# Patient Record
Sex: Female | Born: 1965 | Race: Black or African American | Hispanic: No | Marital: Single | Smoking: Never smoker
Health system: Southern US, Community
[De-identification: ages and names within clinical notes are randomized; demographics above are authoritative.]

## PROBLEM LIST (undated history)

## (undated) DIAGNOSIS — I10 Essential (primary) hypertension: Secondary | ICD-10-CM

## (undated) DIAGNOSIS — K219 Gastro-esophageal reflux disease without esophagitis: Secondary | ICD-10-CM

## (undated) DIAGNOSIS — F419 Anxiety disorder, unspecified: Secondary | ICD-10-CM

## (undated) HISTORY — PX: ABDOMINAL HYSTERECTOMY: SHX81

## (undated) HISTORY — PX: ANKLE SURGERY: SHX546

## (undated) HISTORY — DX: Anxiety disorder, unspecified: F41.9

## (undated) HISTORY — PX: TONSILLECTOMY: SUR1361

## (undated) HISTORY — DX: Gastro-esophageal reflux disease without esophagitis: K21.9

---

## 2016-02-07 DIAGNOSIS — F418 Other specified anxiety disorders: Secondary | ICD-10-CM | POA: Insufficient documentation

## 2016-05-09 DIAGNOSIS — H6982 Other specified disorders of Eustachian tube, left ear: Secondary | ICD-10-CM | POA: Insufficient documentation

## 2018-03-25 DIAGNOSIS — I10 Essential (primary) hypertension: Secondary | ICD-10-CM | POA: Insufficient documentation

## 2019-01-16 ENCOUNTER — Other Ambulatory Visit: Payer: Self-pay

## 2019-01-16 DIAGNOSIS — Z20822 Contact with and (suspected) exposure to covid-19: Secondary | ICD-10-CM

## 2019-01-17 LAB — NOVEL CORONAVIRUS, NAA: SARS-CoV-2, NAA: NOT DETECTED

## 2019-05-18 ENCOUNTER — Ambulatory Visit
Admission: EM | Admit: 2019-05-18 | Discharge: 2019-05-18 | Disposition: A | Discharge status: Home / Self Care | Payer: BC Managed Care – PPO | Attending: Emergency Medicine | Admitting: Emergency Medicine

## 2019-05-18 ENCOUNTER — Other Ambulatory Visit: Payer: Self-pay

## 2019-05-18 DIAGNOSIS — Z20828 Contact with and (suspected) exposure to other viral communicable diseases: Secondary | ICD-10-CM

## 2019-05-18 DIAGNOSIS — Z20822 Contact with and (suspected) exposure to covid-19: Secondary | ICD-10-CM

## 2019-05-18 NOTE — Discharge Instructions (Addendum)
COVID testing ordered.  It will take between 5-7 days for test results.  Someone will contact you regarding abnormal results.   ° °In the meantime: °You should remain isolated in your home for 10 days from symptom onset AND greater than 72 hours after symptoms resolution (absence of fever without the use of fever-reducing medication and improvement in respiratory symptoms), whichever is longer °OR 14 days from exposure °Get plenty of rest and push fluids °Use OTC zyrtec for nasal congestion, runny nose, and/or sore throat °Use OTC flonase for nasal congestion and runny nose °Use medications daily for symptom relief °Use OTC medications like ibuprofen or tylenol as needed fever or pain °Call or go to the ED if you have any new or worsening symptoms such as fever, worsening cough, shortness of breath, chest tightness, chest pain, turning blue, changes in mental status, etc...  °

## 2019-05-18 NOTE — ED Provider Notes (Signed)
[place]   [id number] 05/18/19 Arrival Time: 1938   CC: COVID exposure; covid test  SUBJECTIVE: History from: patient.  Kristen Santiago is a 53 y.o. female who presents for COVID testing.  COVID positive exposure 8 days ago to mother.  Denies recent travel.  Denies aggravating or alleviating symptoms.  Denies previous COVID infection.   Denies fever, chills, fatigue, nasal congestion, rhinorrhea, sore throat, cough, SOB, wheezing, chest pain, nausea, vomiting, changes in bowel or bladder habits.    ROS: As per HPI.  All other pertinent ROS negative.     History reviewed. No pertinent past medical history. History reviewed. No pertinent surgical history. No Known Allergies No current facility-administered medications on file prior to encounter.    No current outpatient medications on file prior to encounter.   Social History   Socioeconomic History  . Marital status: Single    Spouse name: Not on file  . Number of children: Not on file  . Years of education: Not on file  . Highest education level: Not on file  Occupational History  . Not on file  Social Needs  . Financial resource strain: Not on file  . Food insecurity    Worry: Not on file    Inability: Not on file  . Transportation needs    Medical: Not on file    Non-medical: Not on file  Tobacco Use  . Smoking status: Not on file  Substance and Sexual Activity  . Alcohol use: Not on file  . Drug use: Not on file  . Sexual activity: Not on file  Lifestyle  . Physical activity    Days per week: Not on file    Minutes per session: Not on file  . Stress: Not on file  Relationships  . Social Herbalist on phone: Not on file    Gets together: Not on file    Attends religious service: Not on file    Active member of club or organization: Not on file    Attends meetings of clubs or organizations: Not on file    Relationship status: Not on file  . Intimate partner violence    Fear of  current or ex partner: Not on file    Emotionally abused: Not on file    Physically abused: Not on file    Forced sexual activity: Not on file  Other Topics Concern  . Not on file  Social History Narrative  . Not on file   Family History  Problem Relation Age of Onset  . Healthy Mother   . Healthy Father     OBJECTIVE:  Vitals:   05/18/19 1950  BP: (!) 163/88  Pulse: (!) 106  Resp: 16  Temp: 98.1 F (36.7 C)  TempSrc: Oral  SpO2: 96%     General appearance: alert; well-appearing, nontoxic; speaking in full sentences and tolerating own secretions HEENT: NCAT; Ears: EACs clear, TMs pearly gray; Eyes: PERRL.  EOM grossly intact. Nose: nares patent without rhinorrhea, Throat: oropharynx clear, tonsils non erythematous or enlarged, uvula midline  Neck: supple without LAD Lungs: unlabored respirations, symmetrical air entry; cough: absent; no respiratory distress; CTAB Heart: regular rate and rhythm.  Radial pulses 2+ symmetrical bilaterally Skin: warm and dry Psychological: alert and cooperative; normal mood and affect  ASSESSMENT & PLAN:  1. Exposure to COVID-19 virus   2. Encounter for laboratory testing for COVID-19 virus    COVID testing ordered.  It will take between 5-7 days  for test results.  Someone will contact you regarding abnormal results.    In the meantime: You should remain isolated in your home for 10 days from symptom onset AND greater than 72 hours after symptoms resolution (absence of fever without the use of fever-reducing medication and improvement in respiratory symptoms), whichever is longer Or 14 days from exposure Get plenty of rest and push fluids Use OTC zyrtec for nasal congestion, runny nose, and/or sore throat Use OTC flonase for nasal congestion and runny nose Use medications daily for symptom relief Use OTC medications like ibuprofen or tylenol as needed fever or pain Call or go to the ED if you have any new or worsening symptoms such as  fever, worsening cough, shortness of breath, chest tightness, chest pain, turning blue, changes in mental status, etc...   Reviewed expectations re: course of current medical issues. Questions answered. Outlined signs and symptoms indicating need for more acute intervention. Patient verbalized understanding. After Visit Summary given.         Rennis Harding, PA-C 05/18/19 2007

## 2019-05-18 NOTE — ED Triage Notes (Signed)
Pt presents to [facility] stating she had a positive covid exposure and would like a covid test. Pt denies symptoms at this time.

## 2019-05-20 LAB — NOVEL CORONAVIRUS, NAA: SARS-CoV-2, NAA: NOT DETECTED

## 2019-08-16 ENCOUNTER — Ambulatory Visit: Payer: BC Managed Care – PPO | Attending: Internal Medicine

## 2019-08-16 DIAGNOSIS — Z23 Encounter for immunization: Secondary | ICD-10-CM | POA: Insufficient documentation

## 2019-08-16 NOTE — Progress Notes (Signed)
   Covid-19 Vaccination Clinic  Name:  Kristen Santiago    MRN: [id number] DOB: 08-19-1965  08/16/2019  Kristen Santiago was observed post Covid-19 immunization for 30 minutes based on pre-vaccination screening without incident. She was provided with Vaccine Information Sheet and instruction to access the V-Safe system.   Kristen Santiago was instructed to call 911 with any severe reactions post vaccine: Marland Kitchen Difficulty breathing  . Swelling of face and throat  . A fast heartbeat  . A bad rash all over body  . Dizziness and weakness   Immunizations Administered    Name Date Dose VIS Date Route   Pfizer COVID-19 Vaccine 08/16/2019  5:09 PM 0.3 mL 05/22/2019 Intramuscular   Manufacturer: [employer], [employer]   Lot: [id number]   NDC: [address]-9

## 2019-09-06 ENCOUNTER — Ambulatory Visit: Payer: BC Managed Care – PPO | Attending: Internal Medicine

## 2019-09-06 ENCOUNTER — Other Ambulatory Visit: Payer: Self-pay

## 2019-09-06 DIAGNOSIS — Z23 Encounter for immunization: Secondary | ICD-10-CM

## 2019-09-06 NOTE — Progress Notes (Signed)
   Covid-19 Vaccination Clinic  Name:  [name]    MRN: [id number] DOB: 13-May-1966  09/06/2019  [name] was observed post Covid-19 immunization for 15 minutes without incident. She was provided with Vaccine Information Sheet and instruction to access the V-Safe system.   [name] was instructed to call 911 with any severe reactions post vaccine: Marland Kitchen Difficulty breathing  . Swelling of face and throat  . A fast heartbeat  . A bad rash all over body  . Dizziness and weakness   Immunizations Administered    Name Date Dose VIS Date Route   Pfizer COVID-19 Vaccine 09/06/2019  3:03 PM 0.3 mL 05/22/2019 Intramuscular   Manufacturer: [employer], [employer]   Lot: [id number]   NDC: [address]-7

## 2020-10-06 DIAGNOSIS — R0789 Other chest pain: Secondary | ICD-10-CM | POA: Insufficient documentation

## 2020-11-21 ENCOUNTER — Encounter: Payer: BC Managed Care – PPO | Attending: "Endocrinology | Admitting: Nutrition

## 2020-11-21 VITALS — Ht 62.5 in | Wt 185.4 lb

## 2020-11-21 DIAGNOSIS — E669 Obesity, unspecified: Secondary | ICD-10-CM

## 2020-11-21 DIAGNOSIS — K2100 Gastro-esophageal reflux disease with esophagitis, without bleeding: Secondary | ICD-10-CM

## 2020-11-21 NOTE — Progress Notes (Signed)
Medical Nutrition Therapy  Appointment Start time:  [phone]  Appointment End time:  1615  Primary concerns today: GERD, Obesity Referral diagnosis: K21.9, E66.9 Preferred learning style: No preference Learning readiness: Ready    NUTRITION ASSESSMENT   Anthropometrics  Wt Readings from Last 3 Encounters:  11/21/20 185 lb 6.4 oz (84.1 kg)   Ht Readings from Last 3 Encounters:  11/21/20 5' 2.5" (1.588 m)   Body mass index is 33.37 kg/m. @BMIFA @ Facility age limit for growth percentiles is 20 years. Facility age limit for growth percentiles is 20 years.    Clinical Medical Hx: High blood pressure,  Low Vit D levels. Medications: see chart Labs:  Notable Signs/Symptoms: indigestion, heartburn  Lifestyle & Dietary Hx Cooks some at home but eats out often.   Estimated daily fluid intake: 64 oz Supplements: none Sleep: varies Stress / self-care: family Current average weekly physical activity: 20 minutes 3-4 times per week  Estimated Energy Needs Calories: 1200 Carbohydrate: 135g Protein: 90g Fat: 44g   NUTRITION DIAGNOSIS  NB-1.1 Food and nutrition-related knowledge deficit As related to high fat processed foods .  As evidenced by indigestion and heartburn and obesity.   NUTRITION INTERVENTION  Nutrition education (E-1) on the following topics:  Nutrition and  Pre Diabetes education provided on My Plate, CHO counting, meal planning, portion sizes, timing of meals, avoiding snacks between meals , benefits of exercising 60  minutes per day and prevention of complications of DM. Weight loss tips, emotional eating, nutrient dense foods vs empty calorie foods. GERD  Handouts Provided Include  My Plate GERD Handout Weight loss tips  Learning Style & Readiness for Change Teaching method utilized: Visual & Auditory  Demonstrated degree of understanding via: Teach Back  Barriers to learning/adherence to lifestyle change: none  Goals Established by Pt Follow MY  Plate Follow GERD Diet Eat meals on time Cut out high fat and processed foods Eat slowly and chew foods thoughly Drink 100 oz of water per day.   MONITORING & EVALUATION Dietary intake, weekly physical activity, and GERD symptoms in 1 month.  Next Steps  Patient is to work on meal planning .

## 2020-11-23 NOTE — Patient Instructions (Signed)
  Goals Established by Pt Follow MY Plate Follow GERD Diet Eat meals on time Cut out high fat and processed foods Eat slowly and chew foods thoughly Drink 100 oz of water per day.

## 2020-11-28 ENCOUNTER — Encounter: Payer: Self-pay | Admitting: Nutrition

## 2020-12-05 ENCOUNTER — Other Ambulatory Visit: Payer: Self-pay | Admitting: Obstetrics and Gynecology

## 2020-12-05 DIAGNOSIS — Z1231 Encounter for screening mammogram for malignant neoplasm of breast: Secondary | ICD-10-CM

## 2020-12-06 ENCOUNTER — Ambulatory Visit
Admission: RE | Admit: 2020-12-06 | Discharge: 2020-12-06 | Disposition: A | Discharge status: Home / Self Care | Payer: BC Managed Care – PPO | Source: Ambulatory Visit | Attending: Obstetrics and Gynecology | Admitting: Obstetrics and Gynecology

## 2020-12-06 ENCOUNTER — Other Ambulatory Visit: Payer: Self-pay

## 2020-12-06 DIAGNOSIS — Z1231 Encounter for screening mammogram for malignant neoplasm of breast: Secondary | ICD-10-CM

## 2020-12-21 ENCOUNTER — Encounter: Payer: BC Managed Care – PPO | Attending: "Endocrinology | Admitting: Nutrition

## 2020-12-21 ENCOUNTER — Encounter: Payer: Self-pay | Admitting: Nutrition

## 2020-12-21 VITALS — Ht 62.5 in | Wt 180.0 lb

## 2020-12-21 DIAGNOSIS — K2100 Gastro-esophageal reflux disease with esophagitis, without bleeding: Secondary | ICD-10-CM

## 2020-12-21 DIAGNOSIS — E669 Obesity, unspecified: Secondary | ICD-10-CM

## 2020-12-21 NOTE — Patient Instructions (Addendum)
Goals  Avoid fried and processed foods See handout on how to lower Triglycerides Don't skip meals Increase protein and veggies. Keep exercising. Increase more fiber in diet. 25 g per day.

## 2020-12-21 NOTE — Progress Notes (Signed)
Medical Nutrition Therapy Follow up Appointment Start time:  1030 Appointment End time:  1045  Primary concerns today: GERD, Obesity Referral diagnosis: K21.9, E66.9 Preferred learning style: No preference Learning readiness: Ready    NUTRITION ASSESSMENT  Changes made: Struggles eating breakfast.  Working on portion sizes and eating more vegetables and fruit and drinking only water now. GERD is better  Lost 5 lbs since last visit.   Anthropometrics  Wt Readings from Last 3 Encounters:  11/21/20 185 lb 6.4 oz (84.1 kg)   Ht Readings from Last 3 Encounters:  11/21/20 5' 2.5" (1.588 m)   There is no height or weight on file to calculate BMI. @BMIFA @ Facility age limit for growth percentiles is 20 years. Facility age limit for growth percentiles is 20 years.    Clinical Medical Hx: High blood pressure,  Low Vit D levels. Medications: see chart Labs:  Notable Signs/Symptoms: indigestion, heartburn  Lifestyle & Dietary Hx Cooks some at home but eats out often.   Estimated daily fluid intake: 64 oz Supplements: none Sleep: varies Stress / self-care: family Current average weekly physical activity: 20 minutes 3-4 times per week   Estimated Energy Needs Calories: 1200 Carbohydrate: 135g Protein: 90g Fat: 44g   NUTRITION DIAGNOSIS  NB-1.1 Food and nutrition-related knowledge deficit As related to high fat processed foods .  As evidenced by indigestion and heartburn and obesity.   NUTRITION INTERVENTION  Nutrition education (E-1) on the following topics:  Nutrition and  Pre Diabetes education provided on My Plate, CHO counting, meal planning, portion sizes, timing of meals, avoiding snacks between meals , benefits of exercising 60  minutes per day and prevention of complications of DM. Weight loss tips, emotional eating, nutrient dense foods vs empty calorie foods. GERD  Handouts Provided Include  My Plate GERD Handout Weight loss tips  Learning Style &  Readiness for Change Teaching method utilized: Visual & Auditory  Demonstrated degree of understanding via: Teach Back  Barriers to learning/adherence to lifestyle change: none  Goals Established by Pt Goals  Avoid fried and processed foods See handout on how to lower Triglycerides Don't skip meals Increase protein and veggies. Keep exercising. Increase more fiber in diet. 25 g per day.  MONITORING & EVALUATION Dietary intake, weekly physical activity, and GERD symptoms in 1 month.  Next Steps  Patient is to work on meal planning .

## 2021-01-12 ENCOUNTER — Ambulatory Visit
Admission: EM | Admit: 2021-01-12 | Discharge: 2021-01-12 | Disposition: A | Discharge status: Home / Self Care | Payer: BC Managed Care – PPO

## 2021-01-12 ENCOUNTER — Other Ambulatory Visit: Payer: Self-pay

## 2021-01-12 DIAGNOSIS — E782 Mixed hyperlipidemia: Secondary | ICD-10-CM | POA: Insufficient documentation

## 2021-01-12 DIAGNOSIS — E8881 Metabolic syndrome: Secondary | ICD-10-CM | POA: Insufficient documentation

## 2021-01-12 DIAGNOSIS — R030 Elevated blood-pressure reading, without diagnosis of hypertension: Secondary | ICD-10-CM

## 2021-01-12 DIAGNOSIS — F411 Generalized anxiety disorder: Secondary | ICD-10-CM | POA: Insufficient documentation

## 2021-01-12 DIAGNOSIS — R0789 Other chest pain: Secondary | ICD-10-CM

## 2021-01-12 DIAGNOSIS — E669 Obesity, unspecified: Secondary | ICD-10-CM | POA: Insufficient documentation

## 2021-01-12 DIAGNOSIS — R635 Abnormal weight gain: Secondary | ICD-10-CM | POA: Insufficient documentation

## 2021-01-12 DIAGNOSIS — R7989 Other specified abnormal findings of blood chemistry: Secondary | ICD-10-CM | POA: Insufficient documentation

## 2021-01-12 DIAGNOSIS — F418 Other specified anxiety disorders: Secondary | ICD-10-CM | POA: Insufficient documentation

## 2021-01-12 HISTORY — DX: Essential (primary) hypertension: I10

## 2021-01-12 HISTORY — DX: Abnormal weight gain: R63.5

## 2021-01-12 MED ORDER — CLONIDINE HCL 0.1 MG PO TABS
0.1000 mg | ORAL_TABLET | Freq: Once | ORAL | Status: AC
  Administered 2021-01-12: 0.1 mg via ORAL

## 2021-01-12 NOTE — ED Triage Notes (Signed)
Pt states she has been having chest discomfort from lisinopril , switched to amlodipine last week, and then states she has had chest discomfort on right side

## 2021-01-12 NOTE — Discharge Instructions (Addendum)
Unable to rule out cardiac disease, blood clot, or management of hypertensive emergency (high blood pressure in urgent care setting.  Offered patient further evaluation and management in the ED.  Patient declines at this time and would like to try outpatient therapy first.  Aware of the risk associated with this decision including missed diagnosis, organ damage, organ failure, and/or death.  Patient aware and in agreement.     Blood pressure elevated in office Clonidine given in office Begin blood pressure medication tomorrow as prescribed and follow up with PCP Please continue to monitor blood pressure at home and keep a log Eat a well balanced diet of fruits, vegetables and lean meats.  Avoid foods high in fat and salt Drink water.  At least half your body weight in ounces Exercise for at least 30 minutes daily Return or go to the ED if you have any new or worsening symptoms such as vision changes, fatigue, dizziness, chest pain, shortness of breath, nausea, swelling in your hands or feet, urinary symptoms, etc..Marland Kitchen

## 2021-01-12 NOTE — ED Provider Notes (Signed)
[facility]   [id number] 01/12/21 Arrival Time: 1433   CC: CHEST discomfort  SUBJECTIVE:  Kristen Santiago is a 55 y.o. female who presents with complaint of chest discomfort x 2 days ago.  Currently asymptomatic  Symptoms began after taking amlodipine.  Localizes chest pain to the RT side of chest underneath breast.  Describes as improving that is intermittent and sore in character.  Rates pain as 3-4/10.   Has tried tylenol with minimal relief.  Denies aggravating factors with activity/ exertion/ coughing.  Denies radiating symptoms.  Denies previous symptoms in the past.  Denies fever, chills, lightheadedness, dizziness, palpitations, tachycardia, SOB, nausea, vomiting, abdominal pain, changes in bowel or bladder habits, diaphoresis, numbness/tingling in extremities, peripheral edema, or anxiety.    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  All other pertinent ROS negative.    Past Medical History:  Diagnosis Date   Hypertension    History reviewed. No pertinent surgical history. No Known Allergies No current facility-administered medications on file prior to encounter.   Current Outpatient Medications on File Prior to Encounter  Medication Sig Dispense Refill   citalopram (CELEXA) 20 MG tablet      amLODipine (NORVASC) 5 MG tablet Take 5 mg by mouth daily.     bisoprolol (ZEBETA) 5 MG tablet Take 5 mg by mouth daily.     busPIRone (BUSPAR) 10 MG tablet Take 10 mg by mouth 2 (two) times daily.     Social History   Socioeconomic History   Marital status: Single    Spouse name: Not on file   Number of children: Not on file   Years of education: Not on file   Highest education level: Not on file  Occupational History   Not on file  Tobacco Use   Smoking status: Never   Smokeless tobacco: Never   Substance and Sexual Activity   Alcohol use: Not on file   Drug use: Not on file   Sexual activity: Not on file  Other Topics Concern   Not on file  Social History Narrative   Not on file   Social Determinants of Health   Financial Resource Strain: Not on file  Food Insecurity: Not on file  Transportation Needs: Not on file  Physical Activity: Not on file  Stress: Not on file  Social Connections: Not on file  Intimate Partner Violence: Not on file   Family History  Problem Relation Age of Onset   Healthy Mother    Healthy Father      OBJECTIVE:  Vitals:   01/12/21 1506  BP: (!) 170/113  Pulse: 92  Resp: 18  Temp: 98.1 F (36.7 C)  SpO2: 97%    General appearance: alert; no distress Eyes: PERRLA; EOMI; conjunctiva normal HENT: normocephalic; atraumatic Neck: supple Lungs: clear to auscultation bilaterally without adventitious breath sounds Heart: regular rate and rhythm.  Clear S1 and S2 without rubs, gallops, or murmur. Chest Wall: no heaves, lifts or thrills Abdomen: soft, non-tender; bowel sounds normal; no guarding Extremities: no cyanosis or edema; symmetrical with no gross deformities Skin: warm and dry Psychological: alert and cooperative; normal mood and affect  ASSESSMENT & PLAN:  1. Chest discomfort   2. Elevated blood pressure reading     Meds ordered this encounter  Medications   cloNIDine (CATAPRES) tablet 0.1 mg  Unable to rule out cardiac disease, blood clot, or management of hypertensive emergency (high blood pressure in urgent care setting.  Offered patient further evaluation and management in the ED.  Patient declines at this time and would like to try outpatient therapy first.  Aware of the risk associated with this decision including missed diagnosis, organ damage, organ failure, and/or death.  Patient aware and in agreement.     Blood pressure elevated in office Clonidine given in office Begin blood pressure medication tomorrow as  prescribed and follow up with PCP Please continue to monitor blood pressure at home and keep a log Eat a well balanced diet of fruits, vegetables and lean meats.  Avoid foods high in fat and salt Drink water.  At least half your body weight in ounces Exercise for at least 30 minutes daily Return or go to the ED if you have any new or worsening symptoms such as vision changes, fatigue, dizziness, chest pain, shortness of breath, nausea, swelling in your hands or feet, urinary symptoms, etc...   Chest pain precautions given. Reviewed expectations re: course of current medical issues. Questions answered. Outlined signs and symptoms indicating need for more acute intervention. Patient verbalized understanding. After Visit Summary given.   Rennis Harding, PA-C 01/12/21 1541

## 2021-02-06 ENCOUNTER — Other Ambulatory Visit: Payer: Self-pay

## 2021-02-06 ENCOUNTER — Emergency Department (HOSPITAL_COMMUNITY)
Admission: EM | Admit: 2021-02-06 | Discharge: 2021-02-06 | Disposition: A | Discharge status: Home / Self Care | Payer: BC Managed Care – PPO | Attending: Emergency Medicine | Admitting: Emergency Medicine

## 2021-02-06 ENCOUNTER — Emergency Department (HOSPITAL_COMMUNITY): Payer: BC Managed Care – PPO

## 2021-02-06 ENCOUNTER — Encounter (HOSPITAL_COMMUNITY): Payer: Self-pay | Admitting: *Deleted

## 2021-02-06 DIAGNOSIS — R0789 Other chest pain: Secondary | ICD-10-CM | POA: Diagnosis not present

## 2021-02-06 DIAGNOSIS — I10 Essential (primary) hypertension: Secondary | ICD-10-CM | POA: Insufficient documentation

## 2021-02-06 DIAGNOSIS — Z79899 Other long term (current) drug therapy: Secondary | ICD-10-CM | POA: Insufficient documentation

## 2021-02-06 DIAGNOSIS — T7840XA Allergy, unspecified, initial encounter: Secondary | ICD-10-CM | POA: Diagnosis present

## 2021-02-06 LAB — CBC WITH DIFFERENTIAL/PLATELET
Abs Immature Granulocytes: 0.02 10*3/uL (ref 0.00–0.07)
Basophils Absolute: 0 10*3/uL (ref 0.0–0.1)
Basophils Relative: 1 %
Eosinophils Absolute: 0.1 10*3/uL (ref 0.0–0.5)
Eosinophils Relative: 1 %
HCT: 44.2 % (ref 36.0–46.0)
Hemoglobin: 13.4 g/dL (ref 12.0–15.0)
Immature Granulocytes: 0 %
Lymphocytes Relative: 51 %
Lymphs Abs: 4 10*3/uL (ref 0.7–4.0)
MCH: 24 pg — ABNORMAL LOW (ref 26.0–34.0)
MCHC: 30.3 g/dL (ref 30.0–36.0)
MCV: 79.1 fL — ABNORMAL LOW (ref 80.0–100.0)
Monocytes Absolute: 0.7 10*3/uL (ref 0.1–1.0)
Monocytes Relative: 8 %
Neutro Abs: 3.1 10*3/uL (ref 1.7–7.7)
Neutrophils Relative %: 39 %
Platelets: 304 10*3/uL (ref 150–400)
RBC: 5.59 MIL/uL — ABNORMAL HIGH (ref 3.87–5.11)
RDW: 13.9 % (ref 11.5–15.5)
WBC: 7.9 10*3/uL (ref 4.0–10.5)
nRBC: 0 % (ref 0.0–0.2)

## 2021-02-06 LAB — HEPATIC FUNCTION PANEL
ALT: 23 U/L (ref 0–44)
AST: 19 U/L (ref 15–41)
Albumin: 4.4 g/dL (ref 3.5–5.0)
Alkaline Phosphatase: 77 U/L (ref 38–126)
Bilirubin, Direct: 0.1 mg/dL (ref 0.0–0.2)
Indirect Bilirubin: 0.3 mg/dL (ref 0.3–0.9)
Total Bilirubin: 0.4 mg/dL (ref 0.3–1.2)
Total Protein: 7.6 g/dL (ref 6.5–8.1)

## 2021-02-06 LAB — BASIC METABOLIC PANEL
Anion gap: 8 (ref 5–15)
BUN: 14 mg/dL (ref 6–20)
CO2: 25 mmol/L (ref 22–32)
Calcium: 9.6 mg/dL (ref 8.9–10.3)
Chloride: 106 mmol/L (ref 98–111)
Creatinine, Ser: 0.97 mg/dL (ref 0.44–1.00)
GFR, Estimated: >60 mL/min (ref >60)
Glucose, Bld: 93 mg/dL (ref 70–99)
Potassium: 3.6 mmol/L (ref 3.5–5.1)
Sodium: 139 mmol/L (ref 135–145)

## 2021-02-06 NOTE — Discharge Instructions (Signed)
Call your Physician to schedule further evaluation of right sided discomfort and renal mass.  Try mylanta for symptoms. Try ice to area of discomfort

## 2021-02-06 NOTE — ED Triage Notes (Signed)
Pt had some changes to HTN medication, currently taking Amlodipine 5mg  daily and was started on Buspar, pt noted when taken together, pt would have pressure to lower chest since first week of August.  Currently on right side of chest-dull.

## 2021-02-06 NOTE — ED Provider Notes (Signed)
[facility] EMERGENCY DEPARTMENT Provider Note   CSN: [id number] Arrival date & time: 02/06/21  1511     History Chief Complaint  Patient presents with   Allergic Reaction    Kristen Santiago is a 55 y.o. female.  Pt reports she has pain on the right side of her chest under her right breast.  Pt reports symptoms started in August after starting buspar and amlodipine.  Pt feels like she had a reaction to the medication combination.  Pt reports her MD stopped these medication and started her on clonidine.  Pt is concerned that clonidine is causing her to have the same symptoms.  Pt reports her MD advised tylenol but this caused her to have lower abdominal pain.  Pt reports she is taking nexum for reflux.  She reports having a endoscopy by Gi which was normal.  Pt had an abdominal ultrasound 6 months go.    The history is provided by the patient. No language interpreter was used.  Allergic Reaction Presenting symptoms: no difficulty breathing, no rash, no swelling and no wheezing   Severity:  Moderate Relieved by:  Nothing Worsened by:  Nothing     Past Medical History:  Diagnosis Date   Hypertension     There are no problems to display for this patient.   Past Surgical History:  Procedure Laterality Date   ABDOMINAL HYSTERECTOMY     ANKLE SURGERY     TONSILLECTOMY       OB History   No obstetric history on file.     Family History  Problem Relation Age of Onset   Healthy Mother    Healthy Father     Social History   Tobacco Use   Smoking status: Never   Smokeless tobacco: Never  Substance Use Topics   Alcohol use: Not Currently   Drug use: Never    Home Medications Prior to Admission medications   Medication Sig Start Date End Date Taking? Authorizing Provider  amLODipine (NORVASC) 5 MG tablet Take 5 mg by mouth daily. 01/03/21   [provider]  bisoprolol (ZEBETA) 5 MG tablet Take 5 mg by mouth daily. 12/19/20   [provider]   busPIRone (BUSPAR) 10 MG tablet Take 10 mg by mouth 2 (two) times daily. 01/03/21   [provider]  citalopram (CELEXA) 20 MG tablet  10/12/20   [provider]    Allergies    Floxacillin [flucloxacillin] and Lisinopril-hydrochlorothiazide  Review of Systems   Review of Systems  Respiratory:  Negative for wheezing.   Skin:  Negative for rash.  All other systems reviewed and are negative.  Physical Exam Updated Vital Signs BP (!) 176/102 (BP Location: Right Arm)   Pulse 93   Temp 97.6 F (36.4 C) (Oral)   Resp 20   Ht 5' 2.5" (1.588 m)   Wt 81.2 kg   SpO2 100%   BMI 32.22 kg/m   Physical Exam Vitals reviewed.  HENT:     Mouth/Throat:     Mouth: Mucous membranes are moist.  Eyes:     Pupils: Pupils are equal, round, and reactive to light.  Cardiovascular:     Rate and Rhythm: Normal rate and regular rhythm.  Pulmonary:     Effort: Pulmonary effort is normal.     Breath sounds: Normal breath sounds.  Abdominal:     General: Abdomen is flat.  Musculoskeletal:        General: Normal range of motion.  Skin:  General: Skin is warm.     Findings: No rash.  Neurological:     General: No focal deficit present.     Mental Status: She is alert.  Psychiatric:        Mood and Affect: Mood normal.    ED Results / Procedures / Treatments   Labs (all labs ordered are listed, but only abnormal results are displayed) Labs Reviewed  CBC WITH DIFFERENTIAL/PLATELET - Abnormal; Notable for the following components:      Result Value   RBC 5.59 (*)    MCV 79.1 (*)    MCH 24.0 (*)    All other components within normal limits  BASIC METABOLIC PANEL  HEPATIC FUNCTION PANEL    EKG None  Radiology DG Chest 2 View  Result Date: 02/06/2021 CLINICAL DATA:  Right-sided chest pain for several weeks EXAM: CHEST - 2 VIEW COMPARISON:  None. FINDINGS: The heart size and mediastinal contours are within normal limits. Both lungs are clear. The visualized skeletal  structures are unremarkable. IMPRESSION: No acute abnormality of the lungs. Electronically Signed   By: Lauralyn Primes M.D.   On: 02/06/2021 19:34    Procedures Procedures   Medications Ordered in ED Medications - No data to display  ED Course  I have reviewed the triage vital signs and the nursing notes.  Pertinent labs & imaging results that were available during my care of the patient were reviewed by me and considered in my medical decision making (see chart for details).    MDM Rules/Calculators/A&P                          MDM:  EKG no acute abnormality,  Chest xray is normal  cbc, bmet and hepatic function normal.   I doubt pain is cardiac or pulmonary.  Pt blood pressure is elevated. I advised pt to continue clonidine.  Pt's Md advised her to try mylanta.  Pt advised to try.  Pain most likely chest wall.    Final Clinical Impression(s) / ED Diagnoses Final diagnoses:  Chest discomfort    Rx / DC Orders ED Discharge Orders     None     An After Visit Summary was printed and given to the patient.    Osie Cheeks 02/06/21 2311    Cathren Laine, MD 02/07/21 [phone]

## 2021-02-06 NOTE — ED Notes (Signed)
Pt provided discharge instructions and prescription information. Pt was given the opportunity to ask questions and questions were answered. Discharge signature not obtained in the setting of the COVID-19 pandemic in order to reduce high touch surfaces.  ° °

## 2021-03-06 DIAGNOSIS — I493 Ventricular premature depolarization: Secondary | ICD-10-CM | POA: Insufficient documentation

## 2021-03-29 ENCOUNTER — Ambulatory Visit: Payer: BC Managed Care – PPO | Admitting: Nutrition

## 2021-04-18 DIAGNOSIS — N289 Disorder of kidney and ureter, unspecified: Secondary | ICD-10-CM | POA: Insufficient documentation

## 2021-04-18 DIAGNOSIS — K760 Fatty (change of) liver, not elsewhere classified: Secondary | ICD-10-CM | POA: Insufficient documentation

## 2021-04-24 ENCOUNTER — Other Ambulatory Visit (HOSPITAL_BASED_OUTPATIENT_CLINIC_OR_DEPARTMENT_OTHER): Payer: Self-pay | Admitting: Family Medicine

## 2021-04-24 DIAGNOSIS — N289 Disorder of kidney and ureter, unspecified: Secondary | ICD-10-CM

## 2021-04-27 ENCOUNTER — Encounter (HOSPITAL_BASED_OUTPATIENT_CLINIC_OR_DEPARTMENT_OTHER): Payer: Self-pay

## 2021-04-27 ENCOUNTER — Ambulatory Visit (HOSPITAL_BASED_OUTPATIENT_CLINIC_OR_DEPARTMENT_OTHER)
Admission: RE | Admit: 2021-04-27 | Discharge: 2021-04-27 | Disposition: A | Discharge status: Home / Self Care | Payer: BC Managed Care – PPO | Source: Ambulatory Visit | Attending: Family Medicine | Admitting: Family Medicine

## 2021-04-27 ENCOUNTER — Other Ambulatory Visit: Payer: Self-pay

## 2021-04-27 DIAGNOSIS — N289 Disorder of kidney and ureter, unspecified: Secondary | ICD-10-CM | POA: Diagnosis present

## 2021-04-27 MED ORDER — IOHEXOL 350 MG/ML SOLN
75.0000 mL | Freq: Once | INTRAVENOUS | Status: AC | PRN
  Administered 2021-04-27: 15:00:00 75 mL via INTRAVENOUS

## 2021-05-01 ENCOUNTER — Ambulatory Visit: Payer: BC Managed Care – PPO | Admitting: Nutrition

## 2021-07-19 ENCOUNTER — Ambulatory Visit
Admission: EM | Admit: 2021-07-19 | Discharge: 2021-07-19 | Disposition: A | Discharge status: Home / Self Care | Payer: BC Managed Care – PPO | Attending: Family Medicine | Admitting: Family Medicine

## 2021-07-19 ENCOUNTER — Other Ambulatory Visit: Payer: Self-pay

## 2021-07-19 ENCOUNTER — Encounter: Payer: Self-pay | Admitting: Emergency Medicine

## 2021-07-19 DIAGNOSIS — N3090 Cystitis, unspecified without hematuria: Secondary | ICD-10-CM

## 2021-07-19 DIAGNOSIS — R300 Dysuria: Secondary | ICD-10-CM

## 2021-07-19 LAB — POCT URINALYSIS DIP (MANUAL ENTRY)
Bilirubin, UA: NEGATIVE
Glucose, UA: NEGATIVE mg/dL
Ketones, POC UA: NEGATIVE mg/dL
Leukocytes, UA: NEGATIVE
Nitrite, UA: NEGATIVE
Protein Ur, POC: NEGATIVE mg/dL
Spec Grav, UA: 1.025 (ref 1.010–1.025)
Urobilinogen, UA: 0.2 E.U./dL
pH, UA: 5 (ref 5.0–8.0)

## 2021-07-19 MED ORDER — SULFAMETHOXAZOLE-TRIMETHOPRIM 800-160 MG PO TABS: 1.0000 | ORAL_TABLET | Freq: Two times a day (BID) | ORAL | 0 refills | Status: AC

## 2021-07-19 NOTE — ED Triage Notes (Signed)
Lower abd heaviness x several days.  Right lower back pain that radiated to side.

## 2021-07-19 NOTE — Discharge Instructions (Signed)
You have had labs (urine culture) sent today. We will call you with any significant abnormalities or if there is need to begin or change treatment or pursue further follow up.  You may also review your test results online through MyChart. If you do not have a MyChart account, instructions to sign up should be on your discharge paperwork.  

## 2021-07-19 NOTE — ED Provider Notes (Signed)
°  [place]    ASSESSMENT & PLAN:  1. Cystitis   2. Dysuria    Otherwise well. No pain/symptoms that would make me think she has a kidney stone.  Begin: Meds ordered this encounter  Medications   sulfamethoxazole-trimethoprim (BACTRIM DS) 800-160 MG tablet    Sig: Take 1 tablet by mouth 2 (two) times daily for 5 days.    Dispense:  10 tablet    Refill:  0   No signs of pyelonephritis. Urine culture sent. Will follow up with her PCP or here if not showing improvement over the next 48 hours, sooner if needed.  Outlined signs and symptoms indicating need for more acute intervention. Patient verbalized understanding. After Visit Summary given.  SUBJECTIVE:  Kristen Santiago is a 56 y.o. female who complains of mild dysuria and "heavy feeling" over pelvis; first noted 1-2 d ago. Without fever, chills, vaginal discharge or bleeding. Gross hematuria: not present. Mild R flank discomfort that does not radiate. No specific aggravating or alleviating factors reported. No LE edema. Normal PO intake without n/v/d. Without specific abdominal pain. Ambulatory without difficulty. OTC treatment: none. H/O UTI: approx 10 y ago with similar symptoms.  LMP: No LMP recorded. Patient has had a hysterectomy.  OBJECTIVE:  Vitals:   07/19/21 0854  BP: 133/85  Pulse: (!) 109  Resp: 18  Temp: 98.6 F (37 C)  TempSrc: Oral  SpO2: 98%   Slight tachycardia noted.  General appearance: alert; no distress HENT: oropharynx: moist Lungs: unlabored respirations Abdomen: soft, non-tender; bowel sounds normal; no masses or organomegaly; no guarding or rebound tenderness Back: no CVA tenderness Extremities: no edema; symmetrical with no gross deformities Skin: warm and dry Neurologic: normal gait Psychological: alert and cooperative; normal mood and affect  Labs Reviewed  POCT URINALYSIS DIP (MANUAL ENTRY) - Abnormal; Notable for the following components:      Result Value    Blood, UA trace-lysed (*)    All other components within normal limits    Allergies  Allergen Reactions   Floxacillin [Flucloxacillin] Swelling   Lisinopril-Hydrochlorothiazide     Past Medical History:  Diagnosis Date   Hypertension    Social History   Socioeconomic History   Marital status: Single    Spouse name: Not on file   Number of children: Not on file   Years of education: Not on file   Highest education level: Not on file  Occupational History   Not on file  Tobacco Use   Smoking status: Never   Smokeless tobacco: Never  Substance and Sexual Activity   Alcohol use: Not Currently   Drug use: Never   Sexual activity: Not on file  Other Topics Concern   Not on file  Social History Narrative   Not on file   Social Determinants of Health   Financial Resource Strain: Not on file  Food Insecurity: Not on file  Transportation Needs: Not on file  Physical Activity: Not on file  Stress: Not on file  Social Connections: Not on file  Intimate Partner Violence: Not on file   Family History  Problem Relation Age of Onset   Healthy Mother    Healthy Father         Vanessa Kick, MD 07/19/21 [phone]

## 2021-07-25 ENCOUNTER — Encounter (HOSPITAL_COMMUNITY): Payer: Self-pay | Admitting: Emergency Medicine

## 2021-07-25 ENCOUNTER — Emergency Department (HOSPITAL_COMMUNITY): Payer: BC Managed Care – PPO

## 2021-07-25 ENCOUNTER — Emergency Department (HOSPITAL_COMMUNITY)
Admission: EM | Admit: 2021-07-25 | Discharge: 2021-07-25 | Disposition: A | Discharge status: Home / Self Care | Payer: BC Managed Care – PPO | Attending: Emergency Medicine | Admitting: Emergency Medicine

## 2021-07-25 ENCOUNTER — Other Ambulatory Visit: Payer: Self-pay

## 2021-07-25 DIAGNOSIS — R110 Nausea: Secondary | ICD-10-CM | POA: Diagnosis not present

## 2021-07-25 DIAGNOSIS — R109 Unspecified abdominal pain: Secondary | ICD-10-CM | POA: Diagnosis present

## 2021-07-25 LAB — CBC WITH DIFFERENTIAL/PLATELET
Abs Immature Granulocytes: 0.01 10*3/uL (ref 0.00–0.07)
Basophils Absolute: 0.1 10*3/uL (ref 0.0–0.1)
Basophils Relative: 1 %
Eosinophils Absolute: 0.1 10*3/uL (ref 0.0–0.5)
Eosinophils Relative: 1 %
HCT: 45.7 % (ref 36.0–46.0)
Hemoglobin: 14.5 g/dL (ref 12.0–15.0)
Immature Granulocytes: 0 %
Lymphocytes Relative: 59 %
Lymphs Abs: 4 10*3/uL (ref 0.7–4.0)
MCH: 24.5 pg — ABNORMAL LOW (ref 26.0–34.0)
MCHC: 31.7 g/dL (ref 30.0–36.0)
MCV: 77.2 fL — ABNORMAL LOW (ref 80.0–100.0)
Monocytes Absolute: 0.6 10*3/uL (ref 0.1–1.0)
Monocytes Relative: 9 %
Neutro Abs: 2 10*3/uL (ref 1.7–7.7)
Neutrophils Relative %: 30 %
Platelets: 348 10*3/uL (ref 150–400)
RBC: 5.92 MIL/uL — ABNORMAL HIGH (ref 3.87–5.11)
RDW: 13.3 % (ref 11.5–15.5)
WBC: 6.7 10*3/uL (ref 4.0–10.5)
nRBC: 0 % (ref 0.0–0.2)

## 2021-07-25 LAB — URINALYSIS, ROUTINE W REFLEX MICROSCOPIC
Bilirubin Urine: NEGATIVE
Glucose, UA: NEGATIVE mg/dL
Hgb urine dipstick: NEGATIVE
Ketones, ur: NEGATIVE mg/dL
Leukocytes,Ua: NEGATIVE
Nitrite: NEGATIVE
Protein, ur: NEGATIVE mg/dL
Specific Gravity, Urine: 1.006 (ref 1.005–1.030)
pH: 5 (ref 5.0–8.0)

## 2021-07-25 LAB — COMPREHENSIVE METABOLIC PANEL
ALT: 18 U/L (ref 0–44)
AST: 21 U/L (ref 15–41)
Albumin: 4.3 g/dL (ref 3.5–5.0)
Alkaline Phosphatase: 60 U/L (ref 38–126)
Anion gap: 8 (ref 5–15)
BUN: 9 mg/dL (ref 6–20)
CO2: 25 mmol/L (ref 22–32)
Calcium: 10.4 mg/dL — ABNORMAL HIGH (ref 8.9–10.3)
Chloride: 105 mmol/L (ref 98–111)
Creatinine, Ser: 1.2 mg/dL — ABNORMAL HIGH (ref 0.44–1.00)
GFR, Estimated: 53 mL/min — ABNORMAL LOW (ref >60)
Glucose, Bld: 98 mg/dL (ref 70–99)
Potassium: 4.9 mmol/L (ref 3.5–5.1)
Sodium: 138 mmol/L (ref 135–145)
Total Bilirubin: 0.4 mg/dL (ref 0.3–1.2)
Total Protein: 7.4 g/dL (ref 6.5–8.1)

## 2021-07-25 LAB — TROPONIN I (HIGH SENSITIVITY): Troponin I (High Sensitivity): 5 ng/L (ref <18)

## 2021-07-25 LAB — LIPASE, BLOOD: Lipase: 33 U/L (ref 11–51)

## 2021-07-25 MED ORDER — SODIUM CHLORIDE 0.9 % IV SOLN
1.0000 g | INTRAVENOUS | Status: DC
  Administered 2021-07-25: 1 g via INTRAVENOUS
  Filled 2021-07-25: qty 10

## 2021-07-25 MED ORDER — CEPHALEXIN 500 MG PO CAPS: 500.0000 mg | ORAL_CAPSULE | Freq: Two times a day (BID) | ORAL | 0 refills | Status: DC

## 2021-07-25 MED ORDER — MORPHINE SULFATE (PF) 2 MG/ML IV SOLN
2.0000 mg | Freq: Once | INTRAVENOUS | Status: AC
  Administered 2021-07-25: 2 mg via INTRAVENOUS
  Filled 2021-07-25: qty 1

## 2021-07-25 NOTE — ED Notes (Signed)
Patient transported to CT 

## 2021-07-25 NOTE — ED Triage Notes (Signed)
Patient reports UTI last week. Seen at PCP and started on antibiotics. Was having burning with urination. C/o right flank pain, strange taste in mouth and nausea.

## 2021-07-25 NOTE — ED Provider Triage Note (Signed)
Emergency Medicine Provider Triage Evaluation Note  Kristen Santiago , a 56 y.o. female  was evaluated in triage.  Pt complains of right flank pain.    Review of Systems  Positive: Flank pain Negative: syncope  Physical Exam  BP 102/60 (BP Location: Left Arm)    Pulse (!) 55    Temp 97.8 F (36.6 C) (Oral)    Resp 16    Ht 5' 2.5" (1.588 m)    Wt 81.2 kg    SpO2 100%    BMI 32.22 kg/m  Gen:   Awake, no distress   Resp:  Normal effort  MSK:   Moves extremities without difficulty  Other:  R CVAT +  Medical Decision Making  Medically screening exam initiated at 1:48 PM.  Appropriate orders placed.  Kristen Santiago was informed that the remainder of the evaluation will be completed by another provider, this initial triage assessment does not replace that evaluation, and the importance of remaining in the ED until their evaluation is complete.     Theron Arista, PA-C 07/25/21 1916

## 2021-07-25 NOTE — ED Provider Notes (Signed)
Physical Exam  BP (!) 128/58 (BP Location: Left Arm)    Pulse 77    Temp 97.8 F (36.6 C) (Oral)    Resp 16    Ht 5' 2.5" (1.588 m)    Wt 81.2 kg    SpO2 99%    BMI 32.22 kg/m   Physical Exam Vitals and nursing note reviewed.  Constitutional:      General: She is not in acute distress.    Appearance: Normal appearance.  HENT:     Head: Normocephalic and atraumatic.  Eyes:     General:        Right eye: No discharge.        Left eye: No discharge.  Cardiovascular:     Comments: Regular rate and rhythm.  S1/S2 are distinct without any evidence of murmur, rubs, or gallops.  Radial pulses are 2+ bilaterally.  Dorsalis pedis pulses are 2+ bilaterally.  No evidence of pedal edema. Pulmonary:     Comments: Clear to auscultation bilaterally.  Normal effort.  No respiratory distress.  No evidence of wheezes, rales, or rhonchi heard throughout. Abdominal:     General: Abdomen is flat. Bowel sounds are normal. There is no distension.     Tenderness: There is no abdominal tenderness. There is no guarding or rebound.  Musculoskeletal:        General: Normal range of motion.     Cervical back: Neck supple.  Skin:    General: Skin is warm and dry.     Findings: No rash.  Neurological:     General: No focal deficit present.     Mental Status: She is alert.  Psychiatric:        Mood and Affect: Mood normal.        Behavior: Behavior normal.    Procedures  Procedures  ED Course / MDM    Medical Decision Making Amount and/or Complexity of Data Reviewed ECG/medicine tests: ordered.  Risk Prescription drug management.   Accepted handoff at shift change from [facility]. Please see prior provider note for more detail.   Briefly: Patient is 56 y.o. female who presents the emergency department with a chief complaint of right-sided flank pain started roughly a week ago.  She was seen at urgent care and treated for UTI with Bactrim.  Still having persistent symptoms.  Now also having  nausea without any evidence of vomiting.  No fever or chills.  Upon discharge, patient was complaining of low blood pressure and low heart rate.  Troponins were added in addition to EKG. she has had no chest pain during her visit in the emergency department.  Given that she is likely has undertreated UTI and she will be here for repeat troponin ceftriaxone was started.  DDX: concern for undertreated UTI with possible developing pyelonephritis.  I doubt any acute coronary syndrome at this time.  Plan: Get troponins, EKG, and 1 g of ceftriaxone.  Will reassess.    I personally reviewed all the labs.  CMP did not show any significant abnormalities apart from elevated creatinine.  CBC was without any leukocytosis or anemia.  Lipase was negative.  Distal troponin was negative.  I do not feel that a delta troponin is needed at this time.  She does not have any chest pain nor did she ever had chest pain in the emergency department.  Troponins were drawn for a lower heart rate.  Urinalysis is negative.  CT renal stone study was negative for any nephro or  ureterolithiasis.  EKG was normal.  Patient had ceftriaxone in the department for possible pyelonephritis. [facility] sent Keflex to pharmacy.  Patient is safe for discharge in the outpatient setting.  I will also have her follow-up with [facility] to reschedule her colonoscopy.           Honor Loh [place], PA-C 07/25/21 1737    Lorre Nick, MD 07/25/21 2156

## 2021-07-25 NOTE — Discharge Instructions (Addendum)
The CT scan showed no kidney stone or sign of infection.  I recommend that you follow-up with your primary care doctor.  If your symptoms change or worsen, return to the emergency department.

## 2021-07-25 NOTE — ED Provider Notes (Signed)
[facility]EMER[facility]ENCY DEPT [facility] Emergency Department Provider Note MRN:  [id number]  Arrival date & time: 07/25/21     Chief Complaint   Flank Pain   History of Present Illness   Kristen Santiago is a 56 y.o. year-old female presents to the ED with chief complaint of right-sided flank pain that started about a week ago.  She was recently treated for UTI at urgent care with Bactrim.  She states that she has had some persistent symptoms along with now having nausea, but no vomiting.  She denies any fever.    Review of Systems  Pertinent review of systems noted in HPI.    Physical Exam   Vitals:   07/25/21 1333  BP: 102/60  Pulse: (!) 55  Resp: 16  Temp: 97.8 F (36.6 C)  SpO2: 100%    CONSTITUTIONAL: Nontoxic-appearing, NAD NEURO:  Alert and oriented x 3, CN 3-12 grossly intact EYES:  eyes equal and reactive ENT/NECK:  Supple, no stridor  CARDIO:  bradycardic, regular rhythm, appears well-perfused  PULM:  No respiratory distress,  GI/GU:  non-distended, no significant abdominal tenderness MSK/SPINE:  No gross deformities, no edema, moves all extremities  SKIN:  no rash, atraumatic   *Additional and/or pertinent findings included in MDM below  Diagnostic and Interventional Summary    EKG Interpretation  Date/Time:    Ventricular Rate:    PR Interval:    QRS Duration:   QT Interval:    QTC Calculation:   R Axis:     Text Interpretation:         Labs Reviewed  CBC WITH DIFFERENTIAL/PLATELET - Abnormal; Notable for the following components:      Result Value   RBC 5.92 (*)    MCV 77.2 (*)    MCH 24.5 (*)    All other components within normal limits  COMPREHENSIVE METABOLIC PANEL - Abnormal; Notable for the following components:   Creatinine, Ser 1.20 (*)    Calcium 10.4 (*)    GFR, Estimated 53 (*)    All other components within normal limits  URINALYSIS, ROUTINE W REFLEX MICROSCOPIC - Abnormal; Notable for the following components:   Color,  Urine STRAW (*)    All other components within normal limits  URINE CULTURE  LIPASE, BLOOD  TROPONIN I (HIGH SENSITIVITY)    CT Renal Stone Study  Final Result      Medications  cefTRIAXone (ROCEPHIN) 1 g in sodium chloride 0.9 % 100 mL IVPB (has no administration in time range)     Procedures  /  Critical Care Procedures  ED Course and Medical Decision Making  I have reviewed the triage vital signs, the nursing notes, and pertinent available records from the EMR.  Complexity of Problems Addressed: Moderate Complexity: Acute complicated illness or injury, requiring diagnostic workup as ordered and performed below.  ED Course:    After considering the following differential, kidney stone, pyelonephritis, cystitis, appendicitis, I agree with work-up ordered in triage consisting of labs and CT renal.  I reviewed the labs which are notable for mildly elevated creatinine, normal urinalysis, otherwise reassuring labs and I reviewed the CT, which is notable for no evidence of other intra-abdominal process.  I discussed the CT with the radiologist due to his impression not crossing over.   Upon telling patient that I plan to discharge her, she expresses some concern over her heart, and states that she was told that her heart rate was very slow.  We will add EKG and troponin.  Social Determinants Affecting Care: Due to patient's access to medical care, which is decreased because she just relocated, I have provided her with contact information for PCP and GI per her request.    Consultants: I discussed the case with Dr. Charise Killian, from radiology, who states no intraabominal pathology on CT.   Treatment and Plan:  We will treat for subclinical urinary tract infection given recent symptoms.  We will change treatment to Keflex.  Given Rocephin in ED.  Patient signed out to oncoming team.    Final Clinical Impressions(s) / ED Diagnoses     ICD-10-CM   1. Flank pain  R10.9        ED Discharge Orders          Ordered    cephALEXin (KEFLEX) 500 MG capsule  2 times daily        07/25/21 1521             Discharge Instructions Discussed with and Provided to Patient:     Discharge Instructions      The CT scan showed no kidney stone or sign of infection.  I recommend that you follow-up with your primary care doctor.  If your symptoms change or worsen, return to the emergency department.        Roxy Horseman, PA-C 07/25/21 1528    Milagros Loll, MD 07/27/21 2126

## 2021-07-27 ENCOUNTER — Telehealth: Payer: Self-pay | Admitting: Gastroenterology

## 2021-07-27 LAB — URINE CULTURE: Culture: <10000 — AB

## 2021-07-27 NOTE — Telephone Encounter (Signed)
Good Afternoon Dr. Tomasa Rand,   We have records for review on this patient. Patient is wanting to be seen for Flank pain, she was seen at [facility] ER on 2/14 for these symptoms.   Patient was seen for a OV on 4/272022 at [facility] for GERD.  Will you please review records and advise on scheduling.  Thank you.

## 2021-07-28 ENCOUNTER — Encounter: Payer: Self-pay | Admitting: Gastroenterology

## 2021-08-05 ENCOUNTER — Other Ambulatory Visit: Payer: Self-pay

## 2021-08-05 ENCOUNTER — Encounter (HOSPITAL_COMMUNITY): Payer: Self-pay | Admitting: Emergency Medicine

## 2021-08-05 ENCOUNTER — Emergency Department (HOSPITAL_COMMUNITY)
Admission: EM | Admit: 2021-08-05 | Discharge: 2021-08-05 | Disposition: A | Discharge status: Home / Self Care | Payer: BC Managed Care – PPO | Attending: Emergency Medicine | Admitting: Emergency Medicine

## 2021-08-05 ENCOUNTER — Emergency Department (HOSPITAL_COMMUNITY): Payer: BC Managed Care – PPO

## 2021-08-05 DIAGNOSIS — Z79899 Other long term (current) drug therapy: Secondary | ICD-10-CM | POA: Diagnosis not present

## 2021-08-05 DIAGNOSIS — R1084 Generalized abdominal pain: Secondary | ICD-10-CM | POA: Diagnosis present

## 2021-08-05 DIAGNOSIS — N390 Urinary tract infection, site not specified: Secondary | ICD-10-CM | POA: Insufficient documentation

## 2021-08-05 LAB — URINALYSIS, ROUTINE W REFLEX MICROSCOPIC
Bilirubin Urine: NEGATIVE
Glucose, UA: NEGATIVE mg/dL
Hgb urine dipstick: NEGATIVE
Ketones, ur: NEGATIVE mg/dL
Leukocytes,Ua: NEGATIVE
Nitrite: NEGATIVE
Protein, ur: NEGATIVE mg/dL
Specific Gravity, Urine: 1.015 (ref 1.005–1.030)
pH: 5 (ref 5.0–8.0)

## 2021-08-05 LAB — CBC
HCT: 45.2 % (ref 36.0–46.0)
Hemoglobin: 14.2 g/dL (ref 12.0–15.0)
MCH: 24.4 pg — ABNORMAL LOW (ref 26.0–34.0)
MCHC: 31.4 g/dL (ref 30.0–36.0)
MCV: 77.8 fL — ABNORMAL LOW (ref 80.0–100.0)
Platelets: 299 10*3/uL (ref 150–400)
RBC: 5.81 MIL/uL — ABNORMAL HIGH (ref 3.87–5.11)
RDW: 13.8 % (ref 11.5–15.5)
WBC: 6.3 10*3/uL (ref 4.0–10.5)
nRBC: 0 % (ref 0.0–0.2)

## 2021-08-05 LAB — COMPREHENSIVE METABOLIC PANEL
ALT: 28 U/L (ref 0–44)
AST: 24 U/L (ref 15–41)
Albumin: 4.1 g/dL (ref 3.5–5.0)
Alkaline Phosphatase: 60 U/L (ref 38–126)
Anion gap: 7 (ref 5–15)
BUN: 10 mg/dL (ref 6–20)
CO2: 26 mmol/L (ref 22–32)
Calcium: 9.9 mg/dL (ref 8.9–10.3)
Chloride: 107 mmol/L (ref 98–111)
Creatinine, Ser: 0.96 mg/dL (ref 0.44–1.00)
GFR, Estimated: >60 mL/min (ref >60)
Glucose, Bld: 108 mg/dL — ABNORMAL HIGH (ref 70–99)
Potassium: 3.8 mmol/L (ref 3.5–5.1)
Sodium: 140 mmol/L (ref 135–145)
Total Bilirubin: 0.2 mg/dL — ABNORMAL LOW (ref 0.3–1.2)
Total Protein: 7.1 g/dL (ref 6.5–8.1)

## 2021-08-05 LAB — LIPASE, BLOOD: Lipase: 35 U/L (ref 11–51)

## 2021-08-05 MED ORDER — IOHEXOL 350 MG/ML SOLN
75.0000 mL | Freq: Once | INTRAVENOUS | Status: AC | PRN
  Administered 2021-08-05: 75 mL via INTRAVENOUS

## 2021-08-05 NOTE — ED Triage Notes (Signed)
C/o generalized abd pain and nausea since mid January.  Pt took antibiotic beginning of February for UTI.  Seen in ED on 2/14 for flank pain and burning with urination.  States she no longer has painful urination but reports decreased urination.

## 2021-08-05 NOTE — ED Notes (Signed)
Dc instructions reviewed with pt. PT verbalized understanding. PT DC 

## 2021-08-05 NOTE — ED Notes (Signed)
Patient transported to CT 

## 2021-08-05 NOTE — ED Provider Notes (Signed)
[facility] EMERGENCY DEPARTMENT Provider Note   CSN: [id number] Arrival date & time: 08/05/21  1501     History  Chief Complaint  Patient presents with   Abdominal Pain    Kristen Santiago is a 56 y.o. female.  Patient presents chief complaint abdominal pain.  Describes it as mid to lower abdominal pain.  She is been having this pain on and off since January.  States that it is a sharp pain that radiates to her back bilaterally.  She was seen in February here for similar flank pain and diagnosed with urinary tract infection and symptoms were treated with antibiotics.  She states that she is having recurrent painful urination and associated abdominal pain and presents back to the ER.  No reports of fevers or cough or vomiting or diarrhea.      Home Medications Prior to Admission medications   Medication Sig Start Date End Date Taking? Authorizing Provider  amLODipine (NORVASC) 5 MG tablet Take 5 mg by mouth daily. 01/03/21   [provider]  cephALEXin (KEFLEX) 500 MG capsule Take 1 capsule (500 mg total) by mouth 2 (two) times daily. 07/25/21   Roxy Horseman, PA-C  spironolactone (ALDACTONE) 50 MG tablet Take 50 mg by mouth daily.    [provider]      Allergies    Floxacillin [floxacillin (flucloxacillin)] and Lisinopril-hydrochlorothiazide    Review of Systems   Review of Systems  Constitutional:  Negative for fever.  HENT:  Negative for ear pain.   Eyes:  Negative for pain.  Respiratory:  Negative for cough.   Cardiovascular:  Negative for chest pain.  Gastrointestinal:  Positive for abdominal pain.  Genitourinary:  Positive for flank pain.  Musculoskeletal:  Negative for back pain.  Skin:  Negative for rash.  Neurological:  Negative for headaches.   Physical Exam Updated Vital Signs BP (!) 143/73    Pulse 90    Temp 98.7 F (37.1 C) (Oral)    Resp 17    SpO2 100%  Physical Exam Constitutional:      General: She is not in  acute distress.    Appearance: Normal appearance.  HENT:     Head: Normocephalic.     Nose: Nose normal.  Eyes:     Extraocular Movements: Extraocular movements intact.  Cardiovascular:     Rate and Rhythm: Normal rate.  Pulmonary:     Effort: Pulmonary effort is normal.  Abdominal:     Tenderness: There is no abdominal tenderness. There is no guarding or rebound.  Musculoskeletal:        General: Normal range of motion.     Cervical back: Normal range of motion.  Neurological:     General: No focal deficit present.     Mental Status: She is alert. Mental status is at baseline.    ED Results / Procedures / Treatments   Labs (all labs ordered are listed, but only abnormal results are displayed) Labs Reviewed  COMPREHENSIVE METABOLIC PANEL - Abnormal; Notable for the following components:      Result Value   Glucose, Bld 108 (*)    Total Bilirubin 0.2 (*)    All other components within normal limits  CBC - Abnormal; Notable for the following components:   RBC 5.81 (*)    MCV 77.8 (*)    MCH 24.4 (*)    All other components within normal limits  LIPASE, BLOOD  URINALYSIS, ROUTINE W REFLEX MICROSCOPIC    EKG  None  Radiology CT Abdomen Pelvis W Contrast  Result Date: 08/05/2021 CLINICAL DATA:  Abdominal pain, acute, nonlocalized EXAM: CT ABDOMEN AND PELVIS WITH CONTRAST TECHNIQUE: Multidetector CT imaging of the abdomen and pelvis was performed using the standard protocol following bolus administration of intravenous contrast. RADIATION DOSE REDUCTION: This exam was performed according to the departmental dose-optimization program which includes automated exposure control, adjustment of the mA and/or kV according to patient size and/or use of iterative reconstruction technique. CONTRAST:  72mL OMNIPAQUE IOHEXOL 350 MG/ML SOLN COMPARISON:  None. FINDINGS: Lower chest: Lung bases are clear. Hepatobiliary: No focal hepatic lesion. No biliary duct dilatation. Common bile duct is  normal. Pancreas: Pancreas is normal. No ductal dilatation. No pancreatic inflammation. Spleen: Normal spleen Adrenals/urinary tract: Adrenal glands and kidneys are normal. The ureters and bladder normal. Stomach/Bowel: Stomach, small-bowel and cecum are normal. The appendix is not identified but there is no pericecal inflammation to suggest appendicitis. The colon and rectosigmoid colon are normal. Vascular/Lymphatic: Abdominal aorta is normal caliber. No periportal or retroperitoneal adenopathy. No pelvic adenopathy. Reproductive: Post hysterectomy.  Adnexa unremarkable Other: No free fluid. Musculoskeletal: No aggressive osseous lesion. IMPRESSION: 1. No acute findings in the abdomen pelvis. 2. Gallbladder normal. 3. Appendix not identified.  No secondary signs of appendicitis. 4. Post hysterectomy. 5. No bowel obstruction or inflammation. Electronically Signed   By: Genevive Bi M.D.   On: 08/05/2021 17:04    Procedures Procedures    Medications Ordered in ED Medications  iohexol (OMNIPAQUE) 350 MG/ML injection 75 mL (75 mLs Intravenous Contrast Given 08/05/21 1638)    ED Course/ Medical Decision Making/ A&P                           Medical Decision Making Amount and/or Complexity of Data Reviewed Labs: ordered. Radiology: ordered.  Risk Prescription drug management.   Chart review shows telephone visit with gastroenterology July 27, 2021.  Patient on telemetry monitor, regular rate and rhythm no arrhythmias noted.  Work-up today includes urinalysis which was unremarkable.  Chemistry and CBC normal as well.  CT abdomen pelvis pursued with no acute findings per radiology.  Abdominal exam also remains benign.  No guarding or rebound noted.  Patient will be discharged home, advised continued follow-up with her GI physicians.  Advised return for worsening symptoms or any additional concerns.        Final Clinical Impression(s) / ED Diagnoses Final diagnoses:   Generalized abdominal pain    Rx / DC Orders ED Discharge Orders     None         Cheryll Cockayne, MD 08/05/21 [phone]

## 2021-08-05 NOTE — Discharge Instructions (Addendum)
Call your primary care doctor or specialist as discussed in the next 2-3 days.   Return immediately back to the ER if:  Your symptoms worsen within the next 12-24 hours. You develop new symptoms such as new fevers, persistent vomiting, new pain, shortness of breath, or new weakness or numbness, or if you have any other concerns.  

## 2021-08-16 ENCOUNTER — Ambulatory Visit (INDEPENDENT_AMBULATORY_CARE_PROVIDER_SITE_OTHER): Payer: PRIVATE HEALTH INSURANCE | Admitting: Gastroenterology

## 2021-08-16 ENCOUNTER — Other Ambulatory Visit: Payer: Self-pay

## 2021-08-16 ENCOUNTER — Telehealth: Payer: Self-pay | Admitting: Gastroenterology

## 2021-08-16 ENCOUNTER — Encounter: Payer: Self-pay | Admitting: Gastroenterology

## 2021-08-16 VITALS — BP 120/80 | HR 60 | Ht 62.5 in | Wt 162.8 lb

## 2021-08-16 DIAGNOSIS — R1310 Dysphagia, unspecified: Secondary | ICD-10-CM

## 2021-08-16 DIAGNOSIS — R1084 Generalized abdominal pain: Secondary | ICD-10-CM

## 2021-08-16 DIAGNOSIS — K219 Gastro-esophageal reflux disease without esophagitis: Secondary | ICD-10-CM | POA: Diagnosis not present

## 2021-08-16 DIAGNOSIS — R1030 Lower abdominal pain, unspecified: Secondary | ICD-10-CM | POA: Diagnosis not present

## 2021-08-16 MED ORDER — DICYCLOMINE HCL 20 MG PO TABS: 20.0000 mg | ORAL_TABLET | Freq: Four times a day (QID) | ORAL | 1 refills | Status: DC

## 2021-08-16 MED ORDER — SUTAB 1479-225-188 MG PO TABS: 1.0000 | ORAL_TABLET | Freq: Once | ORAL | 0 refills | Status: AC

## 2021-08-16 NOTE — Telephone Encounter (Signed)
Patient called stating she went to the pharmacy to pick up her meds.  They had the colon prep ready, but they told her they never received the Bentyl prescription.  I told her we got a confirmation for 1:25 today that they received it. However, I would let you know what they are telling her.  Please advise.  Thank you. ?

## 2021-08-16 NOTE — Progress Notes (Signed)
HPI : Kristen ParkinsonMarissa Santiago is a pleasant 56 year old female with a history of hypertension who is referred to us by Dr. Oswaldo Conroyaniel Sappenfield for further evaluation and management of multiple GI symptoms.  Patient denies any history of chronic GI symptoms until November of this year and when she started having generalized abdominal pain.  She describes the pain as a tightness in her abdomen, not well localized.  She has trouble providing details in terms of frequency and duration, or factors that make it better or worse, but she is bothered and concerned about the pain because it is new and different for her.  Her bowel movements have been regular with formed brown stools most days.  She denies any problems with diarrhea, constipation or blood in her stool.  Her weight has been stable.  She tried taking a probiotic and Pepto-Bismol which does seem to help a little bit with the pain.  She was seen by a gastroenterologist (Dr. Gretchen PortelaLauren Browne) in [place] and was scheduled for a colonoscopy but elected to [facility] [place] [facility].  She reports having a colonoscopy in 2016 (presumably for colon cancer screening) and believes it was normal. She was seen in the emergency department 2 times times last month and once in urgent care for 4 abdominal pain and flank pain.  A CT abdomen and pelvis on February 25th was unremarkable.  She was seen in the emergency department twice in August for chest pain. She is also been having a lot of upper GI symptoms, mainly with swallowing and throat irritation.  She describes having burning pains in her chest as well as episodic discomfort with swallowing.  She does not necessarily feel like food gets stuck in her esophagus when she swallows, but she experiences a discomfort ("like my throat is narrowing").  No episodes of having to vomit up stuck food.  The discomfort with swallowing does not occur on a regular basis, but she says she was having more  noticeable throat pain several months ago.  It was thought that her hypertension medications were contributing to some of the symptoms, so her medication was switched from lisinopril-HCTZ to olmesartan.  She takes Nexium on occasion for the burning pain, but does not take daily. She was seen by ENT in January of this year because of the throat irritation symptoms and nasopharyngoscopy was unremarkable, she was thought to have may be either viral irritation versus allergic postnasal drip and is recommended to try nasal spray. She underwent an EGD in May 2022 to evaluate chest pain in [place].  The EGD was unremarkable.  Biopsies were taken of the esophagus and stomach which were also normal. She admits that she has been under excessive stress in the last year since she [place] to help take care of her parents.    Past Medical History:  Diagnosis Date   GERD (gastroesophageal reflux disease)    Hypertension      Past Surgical History:  Procedure Laterality Date   ABDOMINAL HYSTERECTOMY     ANKLE SURGERY     TONSILLECTOMY     Family History  Problem Relation Age of Onset   Healthy Mother    Healthy Father    Social History   Tobacco Use   Smoking status: Never   Smokeless tobacco: Never  Substance Use Topics   Alcohol use: Not Currently   Drug use: Never   Current Outpatient Medications  Medication Sig Dispense Refill  spironolactone (ALDACTONE) 50 MG tablet Take 50 mg by mouth daily.     olmesartan (BENICAR) 20 MG tablet Take 20 mg by mouth daily.     No current facility-administered medications for this visit.   Allergies  Allergen Reactions   Floxacillin [Floxacillin (Flucloxacillin)] Swelling   Lisinopril-Hydrochlorothiazide      Review of Systems: All systems reviewed and negative except where noted in HPI.    CT Abdomen Pelvis W Contrast  Result Date: 08/05/2021 CLINICAL DATA:  Abdominal pain, acute, nonlocalized EXAM: CT ABDOMEN  AND PELVIS WITH CONTRAST TECHNIQUE: Multidetector CT imaging of the abdomen and pelvis was performed using the standard protocol following bolus administration of intravenous contrast. RADIATION DOSE REDUCTION: This exam was performed according to the departmental dose-optimization program which includes automated exposure control, adjustment of the mA and/or kV according to patient size and/or use of iterative reconstruction technique. CONTRAST:  5mL OMNIPAQUE IOHEXOL 350 MG/ML SOLN COMPARISON:  None. FINDINGS: Lower chest: Lung bases are clear. Hepatobiliary: No focal hepatic lesion. No biliary duct dilatation. Common bile duct is normal. Pancreas: Pancreas is normal. No ductal dilatation. No pancreatic inflammation. Spleen: Normal spleen Adrenals/urinary tract: Adrenal glands and kidneys are normal. The ureters and bladder normal. Stomach/Bowel: Stomach, small-bowel and cecum are normal. The appendix is not identified but there is no pericecal inflammation to suggest appendicitis. The colon and rectosigmoid colon are normal. Vascular/Lymphatic: Abdominal aorta is normal caliber. No periportal or retroperitoneal adenopathy. No pelvic adenopathy. Reproductive: Post hysterectomy.  Adnexa unremarkable Other: No free fluid. Musculoskeletal: No aggressive osseous lesion. IMPRESSION: 1. No acute findings in the abdomen pelvis. 2. Gallbladder normal. 3. Appendix not identified.  No secondary signs of appendicitis. 4. Post hysterectomy. 5. No bowel obstruction or inflammation. Electronically Signed   By: Genevive Bi M.D.   On: 08/05/2021 17:04   CT Renal Stone Study  Result Date: 07/25/2021 CLINICAL DATA:  Right flank pain. EXAM: CT ABDOMEN AND PELVIS WITHOUT CONTRAST TECHNIQUE: Multidetector CT imaging of the abdomen and pelvis was performed following the standard protocol without IV contrast. RADIATION DOSE REDUCTION: This exam was performed according to the departmental dose-optimization program which  includes automated exposure control, adjustment of the mA and/or kV according to patient size and/or use of iterative reconstruction technique. COMPARISON:  CT abdomen dated April 27, 2021. FINDINGS: Lower chest: No acute abnormality. Hepatobiliary: No focal liver abnormality is seen. No gallstones, gallbladder wall thickening, or biliary dilatation. Pancreas: Unremarkable. No pancreatic ductal dilatation or surrounding inflammatory changes. Spleen: Normal in size without focal abnormality. Adrenals/Urinary Tract: Adrenal glands are unremarkable. Kidneys are normal, without renal calculi, focal lesion, or hydronephrosis. Bladder is unremarkable. Stomach/Bowel: Stomach is within normal limits. Diminutive or absent appendix. No evidence of bowel wall thickening, distention, or inflammatory changes. Vascular/Lymphatic: No significant vascular findings are present. No enlarged abdominal or pelvic lymph nodes. Reproductive: Status post hysterectomy. No adnexal masses. Other: No abdominal wall hernia or abnormality. No abdominopelvic ascites. No pneumoperitoneum. Musculoskeletal: No acute or significant osseous findings. IMPRESSION: 1. No acute intra-abdominal process. Electronically Signed   By: Obie Dredge M.D.   On: 07/25/2021 14:37    Physical Exam: BP 120/80    Pulse 60    Ht 5' 2.5" (1.588 m)    Wt 162 lb 12.8 oz (73.8 kg)    BMI 29.30 kg/m  Constitutional: Pleasant,well-developed, African-American female in no acute distress. HEENT: Normocephalic and atraumatic. Conjunctivae are normal. No scleral icterus. Cardiovascular: Normal rate, regular rhythm.  Pulmonary/chest: Effort normal and breath sounds  normal. No wheezing, rales or rhonchi. Abdominal: Soft, nondistended, mild multifocal tenderness to palpation in epigastrium and right lower quadrant, no rigidity or guarding. Bowel sounds active throughout. There are no masses palpable. No hepatomegaly. Extremities: no edema Neurological: Alert and  oriented to person place and time. Skin: Skin is warm and dry. No rashes noted. Psychiatric: Normal mood and affect. Behavior is normal.  CBC    Component Value Date/Time   WBC 6.3 08/05/2021 1505   RBC 5.81 (H) 08/05/2021 1505   HGB 14.2 08/05/2021 1505   HCT 45.2 08/05/2021 1505   PLT 299 08/05/2021 1505   MCV 77.8 (L) 08/05/2021 1505   MCH 24.4 (L) 08/05/2021 1505   MCHC 31.4 08/05/2021 1505   RDW 13.8 08/05/2021 1505   LYMPHSABS 4.0 07/25/2021 1357   MONOABS 0.6 07/25/2021 1357   EOSABS 0.1 07/25/2021 1357   BASOSABS 0.1 07/25/2021 1357    CMP     Component Value Date/Time   NA 140 08/05/2021 1505   K 3.8 08/05/2021 1505   CL 107 08/05/2021 1505   CO2 26 08/05/2021 1505   GLUCOSE 108 (H) 08/05/2021 1505   BUN 10 08/05/2021 1505   CREATININE 0.96 08/05/2021 1505   CALCIUM 9.9 08/05/2021 1505   PROT 7.1 08/05/2021 1505   ALBUMIN 4.1 08/05/2021 1505   AST 24 08/05/2021 1505   ALT 28 08/05/2021 1505   ALKPHOS 60 08/05/2021 1505   BILITOT 0.2 (L) 08/05/2021 1505   GFRNONAA >60 08/05/2021 1505     ASSESSMENT AND PLAN: 56 year old female with longstanding upper GI complaints of epigastric pain, heartburn/atypical chest pain with normal upper endoscopy in May of last year, now with lower abdominal pain and vague swallowing difficulties not entirely consistent with esophageal dysphagia.  She had previously been scheduled for colonoscopy with another provider in [place] to evaluate this new lower abdominal pain, but switched her medical care to [facility] to where she now lives.  Recent CT and basic labs have been unremarkable.  Patient has a history of multiple emergency department visits for somatic complaints.  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  The patient did not seem to be very receptive to this concept, and I agreed to proceed with an upper and lower endoscopy for reassurance, and then we can follow-up and  discuss gut brain axis disorders more in detail and the principles of their management. For her GERD symptoms, I recommend she take her Nexium on a daily basis rather than as needed, this may help with some of her other vague chest/throat symptoms.  Dysphagia/odynophagia - EGD  Lower abdominal pain - Colonoscopy  GERD symptoms -Nexium OTC daily (change from PRN)  The details, risks (including bleeding, perforation, infection, missed lesions, medication reactions and possible hospitalization or surgery if complications occur), benefits, and alternatives to EGD/colonoscopy with possible biopsy and possible polypectomy were discussed with the patient and she consents to proceed.   Matheus Spiker E. Tomasa Rand, MD [facility]   CC:  Donovan Kail, *

## 2021-08-16 NOTE — Telephone Encounter (Signed)
Resent Bentyl 20 mg to patients pharmacy [facility]. ?

## 2021-08-16 NOTE — Patient Instructions (Addendum)
If you are age 57 or older, your body mass index should be between 23-30. Your Body mass index is 29.3 kg/m?Marland Kitchen If this is out of the aforementioned range listed, please consider follow up with your Primary Care Provider. ? ?If you are age 80 or younger, your body mass index should be between 19-25. Your Body mass index is 29.3 kg/m?Marland Kitchen If this is out of the aformentioned range listed, please consider follow up with your Primary Care Provider.  ? ?Take Nexium daily 30-45 minutes before Breakfast. ? ?We have sent the following medications to your pharmacy for you to pick up at your convenience: Bentyl 20 mg every 6 hours as needed for pain. ? ?You have been scheduled for an endoscopy and colonoscopy. Please follow the written instructions given to you at your visit today. ?Please pick up your prep supplies at the pharmacy within the next 1-3 days. ?If you use inhalers (even only as needed), please bring them with you on the day of your procedure.  ? ?The [facility] providers would like to encourage you to use [facility] to communicate with providers for non-urgent requests or questions.  Due to long hold times on the telephone, sending your provider a message by [facility] may be a faster and more efficient way to get a response.  Please allow 48 business hours for a response.  Please remember that this is for non-urgent requests.  ? ?It was a pleasure to see you today! ? ?Thank you for trusting me with your gastrointestinal care!   ? ?Scott E.Candis Schatz, MD  ? ?

## 2021-08-16 NOTE — Telephone Encounter (Signed)
Patient called and stated that she went to the pharmacy to pick up her prep and Bentyl and they only had the prep medication. Patient is requesting that you resend Bentyl. Please advise.  ?

## 2021-08-17 ENCOUNTER — Ambulatory Visit (INDEPENDENT_AMBULATORY_CARE_PROVIDER_SITE_OTHER): Payer: PRIVATE HEALTH INSURANCE | Admitting: Family

## 2021-08-17 ENCOUNTER — Encounter: Payer: Self-pay | Admitting: Family

## 2021-08-17 VITALS — BP 148/72 | HR 82 | Temp 97.6°F | Ht 62.0 in | Wt 164.1 lb

## 2021-08-17 DIAGNOSIS — F418 Other specified anxiety disorders: Secondary | ICD-10-CM | POA: Diagnosis not present

## 2021-08-17 DIAGNOSIS — I10 Essential (primary) hypertension: Secondary | ICD-10-CM

## 2021-08-17 DIAGNOSIS — K219 Gastro-esophageal reflux disease without esophagitis: Secondary | ICD-10-CM | POA: Insufficient documentation

## 2021-08-17 MED ORDER — LISINOPRIL 30 MG PO TABS: 30.0000 mg | ORAL_TABLET | Freq: Every day | ORAL | 2 refills | Status: DC

## 2021-08-17 MED ORDER — HYDROXYZINE HCL 10 MG PO TABS: 10.0000 mg | ORAL_TABLET | Freq: Three times a day (TID) | ORAL | 0 refills | Status: DC | PRN

## 2021-08-17 NOTE — Progress Notes (Signed)
? ?New Patient Office Visit ? ?Subjective:  ?Patient ID: Kristen Santiago, female    DOB: 07/19/1965  Age: 56 y.o. MRN: [id number] ? ?CC:  ?Chief Complaint  ?Patient presents with  ? Establish Care  ? Hypertension  ?  Pt would like to discuss HTN.   ? ? ?HPI ?Kristen Santiago presents for establishing care and to discuss 2 problems. ? ?Hypertension: Patient is currently maintained on the following medications for blood pressure: Olmesartan, Spironolactone ?Failed meds include: Lisinopril-HCTZ ?Patient reports good compliance with blood pressure medications. ?Patient denies chest pain, headaches, shortness of breath or swelling. ?Last 3 blood pressure readings in our office are as follows: ?BP Readings from Last 3 Encounters:  ?08/17/21 (!) 148/72  ?08/16/21 120/80  ?08/05/21 (!) 145/78  ?Anxiety/Depression: Patient complains of anxiety disorder.   ?She has the following symptoms: difficulty concentrating, fatigue, irritable, palpitations, racing thoughts, sweating.  ?Onset of symptoms was approximately  months ago, She denies current suicidal and homicidal ideation.  ?Possible organic causes contributing are: none.  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   ?Previous treatment includes no meds or counseling.   ? ?Depression screen [facility] 2/9 08/17/2021  ?Decreased Interest 0  ?Down, Depressed, Hopeless 1  ?PHQ - 2 Score 1  ?Altered sleeping 2  ?Tired, decreased energy 1  ?Change in appetite 0  ?Feeling bad or failure about yourself  0  ?Trouble concentrating 1  ?Moving slowly or fidgety/restless 0  ?Suicidal thoughts 0  ?PHQ-9 Score 5  ?Difficult doing work/chores Somewhat difficult  ? ?GAD 7 : Generalized Anxiety Score 08/17/2021  ?Nervous, Anxious, on Edge 3  ?Control/stop worrying 2  ?Worry too much - different things 3  ?Trouble relaxing 2  ?Restless 2  ?Easily annoyed or irritable 2  ?Afraid - awful might happen 3  ?Total GAD 7 Score 17  ?Anxiety Difficulty Somewhat  difficult  ? ? ?Past Medical History:  ?Diagnosis Date  ? Anxiety   ? GERD (gastroesophageal reflux disease)   ? Hypertension   ? Weight increased 01/12/2021  ? ? ?Past Surgical History:  ?Procedure Laterality Date  ? ABDOMINAL HYSTERECTOMY    ? ANKLE SURGERY    ? TONSILLECTOMY    ? ? ?Family History  ?Problem Relation Age of Onset  ? Healthy Mother   ? Healthy Father   ? ? ?Social History  ? ?Socioeconomic History  ? Marital status: Single  ?  Spouse name: Not on file  ? Number of children: Not on file  ? Years of education: Not on file  ? Highest education level: Not on file  ?Occupational History  ? Not on file  ?Tobacco Use  ? Smoking status: Never  ? Smokeless tobacco: Never  ?Vaping Use  ? Vaping Use: Never used  ?Substance and Sexual Activity  ? Alcohol use: Not Currently  ? Drug use: Never  ? Sexual activity: Never  ?  Birth control/protection: None  ?Other Topics Concern  ? Not on file  ?Social History Narrative  ? Not on file  ? ?Social Determinants of Health  ? ?Financial Resource Strain: Not on file  ?Food Insecurity: Not on file  ?Transportation Needs: Not on file  ?Physical Activity: Not on file  ?Stress: Not on file  ?Social Connections: Not on file  ?Intimate Partner Violence: Not on file  ? ? ?Objective:  ? ?Today's Vitals: BP (!) 148/72   Pulse 82   Temp 97.6 ?F (36.4 ?C) (Temporal)  Ht 5\' 2"  (1.575 m)   Wt 164 lb 2 oz (74.4 kg)   SpO2 100%   BMI 30.02 kg/m?  ? ?Physical Exam ?Vitals and nursing note reviewed.  ?Constitutional:   ?   Appearance: Normal appearance.  ?Cardiovascular:  ?   Rate and Rhythm: Normal rate and regular rhythm.  ?Pulmonary:  ?   Effort: Pulmonary effort is normal.  ?   Breath sounds: Normal breath sounds.  ?Musculoskeletal:     ?   General: Normal range of motion.  ?Skin: ?   General: Skin is warm and dry.  ?Neurological:  ?   Mental Status: She is alert.  ?Psychiatric:     ?   Mood and Affect: Mood normal.     ?   Behavior: Behavior normal.  ? ? ?Assessment & Plan:   ? ?Problem List Items Addressed This Visit   ? ?  ? Cardiovascular and Mediastinum  ? Chronic hypertension - Primary  ?  Switched to Olmesartan and Spironolactone 5 months ago, since then she has developed stomach pain, UTI, and leg cramps. She had been on Lisinopril for long time which worked for but then BP increased so HCTZ was added but she then developed an allergy. BP high today. Advised pt to stop the Olmesartan, continue Spironolactone for now, adding back Lisinopril. Last CMP a month ago wnl, will recheck in another month. ?  ?  ? Relevant Medications  ? lisinopril (ZESTRIL) 30 MG tablet  ? spironolactone (ALDACTONE) 25 MG tablet  ?  ? Other  ? Situational anxiety  ?  no previous meds, starting hydroxyzine, advised on use & SE. f/u in 1 mo. ?  ?  ? Relevant Medications  ? hydrOXYzine (ATARAX) 10 MG tablet  ? ? ?Outpatient Encounter Medications as of 08/17/2021  ?Medication Sig  ? dicyclomine (BENTYL) 20 MG tablet Take 1 tablet (20 mg total) by mouth every 6 (six) hours.  ? hydrOXYzine (ATARAX) 10 MG tablet Take 1 tablet (10 mg total) by mouth 3 (three) times daily as needed.  ? lisinopril (ZESTRIL) 30 MG tablet Take 1 tablet (30 mg total) by mouth daily.  ? spironolactone (ALDACTONE) 25 MG tablet Take 25 mg by mouth daily.  ? [DISCONTINUED] olmesartan (BENICAR) 20 MG tablet Take 20 mg by mouth daily.  ? [DISCONTINUED] spironolactone (ALDACTONE) 50 MG tablet Take 50 mg by mouth daily.  ? ?No facility-administered encounter medications on file as of 08/17/2021.  ? ? ?Follow-up: No follow-ups on file.  ? ?10/17/2021, NP ?

## 2021-08-17 NOTE — Patient Instructions (Addendum)
Welcome to [facility]! It was a pleasure meeting you today. ? ?As discussed, STOP the Olmesartan and start the Lisinopril tomorrow am that I have sent to your pharmacy. Continue the Spironolactone daily for now. ?Continue to limit the sodium in your diet and drink at least 2 liters = 8 cups of water daily. Refer to the handout attached. ? ?Please schedule a 1 month follow up visit today so I can see how you are doing on the medicine and recheck your blood pressure. ? ? ? ?PLEASE NOTE: ? ?If you had any LAB tests please let us know if you have not heard back within a few days. You may see your results on MyChart before we have a chance to review them but we will give you a call once they are reviewed by [place]. If we ordered any REFERRALS today, please let us know if you have not heard from their office within the next week.  ?Let us know through MyChart if you are needing REFILLS, or have your pharmacy send [place] the request. You can also use MyChart to communicate with me or any office staff. ? ?Please try these tips to maintain a healthy lifestyle: ? ?Eat most of your calories during the day when you are active. Eliminate processed foods including packaged sweets (pies, cakes, cookies), reduce intake of potatoes, white bread, white pasta, and white rice. Look for whole grain options, oat flour or almond flour. ? ?Each meal should contain half fruits/vegetables, one quarter protein, and one quarter carbs (no bigger than a computer mouse). ? ?Cut down on sweet beverages. This includes juice, soda, and sweet tea. Also watch fruit intake, though this is a healthier sweet option, it still contains natural sugar! Limit to 3 servings daily. ? ?Drink at least 1 glass of water with each meal and aim for at least 8 glasses per day ? ?Exercise at least 150 minutes every week.  ? ?

## 2021-08-18 ENCOUNTER — Encounter: Payer: Self-pay | Admitting: Family

## 2021-08-18 NOTE — Assessment & Plan Note (Signed)
no previous meds, starting hydroxyzine, advised on use & SE. f/u in 1 mo. ?

## 2021-08-18 NOTE — Assessment & Plan Note (Signed)
Switched to Olmesartan and Spironolactone 5 months ago, since then she has developed stomach pain, UTI, and leg cramps. She had been on Lisinopril for long time which worked for [employer] but then BP increased so HCTZ was added but she then developed an allergy. BP high today. Advised pt to stop the Olmesartan, continue Spironolactone for now, adding back Lisinopril. Last CMP a month ago wnl, will recheck in another month. ?

## 2021-08-22 NOTE — Telephone Encounter (Signed)
Called the pharmacy and confirmed patient has picked up Bentyl script ?

## 2021-09-01 ENCOUNTER — Telehealth: Payer: Self-pay | Admitting: Gastroenterology

## 2021-09-01 ENCOUNTER — Other Ambulatory Visit: Payer: Self-pay

## 2021-09-01 MED ORDER — SUTAB 1479-225-188 MG PO TABS: 1.0000 | ORAL_TABLET | Freq: Once | ORAL | 0 refills | Status: AC

## 2021-09-01 NOTE — Telephone Encounter (Signed)
Patient called and said her prep for her upcoming procedures on 09/06/21 has not been called into the [facility] in [place].  Please call patient and advise.  Thank you. ?

## 2021-09-01 NOTE — Telephone Encounter (Signed)
Resent medication to patients pharmacy and confirmed they received prescription. ?

## 2021-09-06 ENCOUNTER — Encounter: Payer: Self-pay | Admitting: Gastroenterology

## 2021-09-06 ENCOUNTER — Ambulatory Visit (AMBULATORY_SURGERY_CENTER): Payer: PRIVATE HEALTH INSURANCE | Admitting: Gastroenterology

## 2021-09-06 VITALS — BP 136/85 | HR 85 | Temp 97.1°F | Resp 14 | Ht 62.0 in | Wt 162.0 lb

## 2021-09-06 DIAGNOSIS — R1310 Dysphagia, unspecified: Secondary | ICD-10-CM

## 2021-09-06 DIAGNOSIS — R0789 Other chest pain: Secondary | ICD-10-CM

## 2021-09-06 DIAGNOSIS — R1030 Lower abdominal pain, unspecified: Secondary | ICD-10-CM

## 2021-09-06 DIAGNOSIS — K449 Diaphragmatic hernia without obstruction or gangrene: Secondary | ICD-10-CM | POA: Diagnosis not present

## 2021-09-06 DIAGNOSIS — R109 Unspecified abdominal pain: Secondary | ICD-10-CM | POA: Diagnosis not present

## 2021-09-06 DIAGNOSIS — K2970 Gastritis, unspecified, without bleeding: Secondary | ICD-10-CM | POA: Diagnosis not present

## 2021-09-06 DIAGNOSIS — K2950 Unspecified chronic gastritis without bleeding: Secondary | ICD-10-CM | POA: Diagnosis not present

## 2021-09-06 MED ORDER — SODIUM CHLORIDE 0.9 % IV SOLN: 500.0000 mL | Freq: Once | INTRAVENOUS | Status: DC

## 2021-09-06 NOTE — Patient Instructions (Addendum)
Handout was given to your care partner on a Hiatal Hernia. ?Resume your current medications today. ?Await biopsy results.  May take 1-3 weeks to receive pathology results. ?Follow up as needed in clinic to discuss further evaluation of chronic GI symptoms. ?Repeat screening colonoscopy in 10 years. ?Please call if any questions or concerns. ?  ? ? ? ?YOU HAD AN ENDOSCOPIC PROCEDURE TODAY AT THE [facility]:   Refer to the procedure report that was given to you for any specific questions about what was found during the examination.  If the procedure report does not answer your questions, please call your gastroenterologist to clarify.  If you requested that your care partner not be given the details of your procedure findings, then the procedure report has been included in a sealed envelope for you to review at your convenience later. ? ?YOU SHOULD EXPECT: Some feelings of bloating in the abdomen. Passage of more gas than usual.  Walking can help get rid of the air that was put into your GI tract during the procedure and reduce the bloating. If you had a lower endoscopy (such as a colonoscopy or flexible sigmoidoscopy) you may notice spotting of blood in your stool or on the toilet paper. If you underwent a bowel prep for your procedure, you may not have a normal bowel movement for a few days. ? ?Please Note:  You might notice some irritation and congestion in your nose or some drainage.  This is from the oxygen used during your procedure.  There is no need for concern and it should clear up in a day or so. ? ?SYMPTOMS TO REPORT IMMEDIATELY: ? ?Following lower endoscopy (colonoscopy or flexible sigmoidoscopy): ? Excessive amounts of blood in the stool ? Significant tenderness or worsening of abdominal pains ? Swelling of the abdomen that is new, acute ? Fever of 100?F or higher ? ?Following upper endoscopy (EGD) ? Vomiting of blood or coffee ground material ? New chest pain or pain under the shoulder  blades ? Painful or persistently difficult swallowing ? New shortness of breath ? Fever of 100?F or higher ? Black, tarry-looking stools ? ?For urgent or emergent issues, a gastroenterologist can be reached at any hour by calling [phone]. ?Do not use MyChart messaging for urgent concerns.  ? ? ?DIET:  We do recommend a small meal at first, but then you may proceed to your regular diet.  Drink plenty of fluids but you should avoid alcoholic beverages for 24 hours. ? ?ACTIVITY:  You should plan to take it easy for the rest of today and you should NOT DRIVE or use heavy machinery until tomorrow (because of the sedation medicines used during the test).   ? ?FOLLOW UP: ?Our staff will call the number listed on your records 48-72 hours following your procedure to check on you and address any questions or concerns that you may have regarding the information given to you following your procedure. If we do not reach you, we will leave a message.  We will attempt to reach you two times.  During this call, we will ask if you have developed any symptoms of COVID 19. If you develop any symptoms (ie: fever, flu-like symptoms, shortness of breath, cough etc.) before then, please call [phone].  If you test positive for Covid 19 in the 2 weeks post procedure, please call and report this information to [place].   ? ?If any biopsies were taken you will be contacted by phone or by  letter within the next 1-3 weeks.  Please call us at [phone] if you have not heard about the biopsies in 3 weeks.  ? ? ?SIGNATURES/CONFIDENTIALITY: ?You and/or your care partner have signed paperwork which will be entered into your electronic medical record.  These signatures attest to the fact that that the information above on your After Visit Summary has been reviewed and is understood.  Full responsibility of the confidentiality of this discharge information lies with you and/or your care-partner.  ?

## 2021-09-06 NOTE — Op Note (Signed)
[facility] ?Patient Name: Kristen Santiago ?Procedure Date: 09/06/2021 2:07 PM ?MRN: [id number] ?Endoscopist: Inice Sanluis E. Candis Schatz , MD ?Age: 56 ?Referring MD:  ?Date of Birth: 07/30/1965 ?Gender: Female ?Account #: [id number] ?Procedure:                Upper GI endoscopy ?Indications:              Dysphagia, Chest pain (non cardiac) ?Medicines:                Monitored Anesthesia Care ?Procedure:                Pre-Anesthesia Assessment: ?                          - Prior to the procedure, a History and Physical  ?                          was performed, and patient medications and  ?                          allergies were reviewed. The patient's tolerance of  ?                          previous anesthesia was also reviewed. The risks  ?                          and benefits of the procedure and the sedation  ?                          options and risks were discussed with the patient.  ?                          All questions were answered, and informed consent  ?                          was obtained. Prior Anticoagulants: The patient has  ?                          taken no previous anticoagulant or antiplatelet  ?                          agents. ASA Grade Assessment: II - A patient with  ?                          mild systemic disease. After reviewing the risks  ?                          and benefits, the patient was deemed in  ?                          satisfactory condition to undergo the procedure. ?                          After obtaining informed consent, the endoscope was  ?  passed under direct vision. Throughout the  ?                          procedure, the patient's blood pressure, pulse, and  ?                          oxygen saturations were monitored continuously. The  ?                          GIF [id number] was introduced through the  ?                          mouth, and advanced to the third part of duodenum.  ?                          The upper GI  endoscopy was accomplished without  ?                          difficulty. The patient tolerated the procedure  ?                          well. ?Scope In: ?Scope Out: ?Findings:                 The examined esophagus was normal. Biopsies were  ?                          obtained from the proximal and distal esophagus  ?                          with cold forceps for histology of suspected  ?                          eosinophilic esophagitis. Estimated blood loss was  ?                          minimal. ?                          A 3 cm hiatal hernia was present. ?                          Patchy mildly erythematous mucosa without bleeding  ?                          was found in the gastric antrum. Scant hematin was  ?                          present in the gastric lumen. Biopsies were taken  ?                          with a cold forceps for Helicobacter pylori  ?                          testing. Estimated blood loss was minimal. ?  The examined duodenum was normal. ?Complications:            No immediate complications. ?Estimated Blood Loss:     Estimated blood loss was minimal. ?Impression:               - Normal esophagus. ?                          - 3 cm hiatal hernia. ?                          - Erythematous mucosa in the antrum. Biopsied. ?                          - Normal examined duodenum. ?                          - No obvious abnormalities to explain dysphagia or  ?                          chest pain. ?Recommendation:           - Patient has a contact number available for  ?                          emergencies. The signs and symptoms of potential  ?                          delayed complications were discussed with the  ?                          patient. Return to normal activities tomorrow.  ?                          Written discharge instructions were provided to the  ?                          patient. ?                          - Resume previous diet. ?                           - Continue present medications. ?                          - Await pathology results. ?                          - Follow up as needed in clinic to discuss further  ?                          evaluation of chronic GI symptoms. ?Maysun Meditz E. Candis Schatz, MD ?09/06/2021 3:15:16 PM ?This report has been signed electronically. ?

## 2021-09-06 NOTE — Op Note (Signed)
[facility] ?Patient Name: Kristen Santiago ?Procedure Date: 09/06/2021 2:07 PM ?MRN: [id number] ?Endoscopist: Cordaro Mukai E. Candis Schatz , MD ?Age: 56 ?Referring MD:  ?Date of Birth: 29-Jan-1966 ?Gender: Female ?Account #: [id number] ?Procedure:                Colonoscopy ?Indications:              Generalized abdominal pain ?Medicines:                Monitored Anesthesia Care ?Procedure:                Pre-Anesthesia Assessment: ?                          - Prior to the procedure, a History and Physical  ?                          was performed, and patient medications and  ?                          allergies were reviewed. The patient's tolerance of  ?                          previous anesthesia was also reviewed. The risks  ?                          and benefits of the procedure and the sedation  ?                          options and risks were discussed with the patient.  ?                          All questions were answered, and informed consent  ?                          was obtained. Prior Anticoagulants: The patient has  ?                          taken no previous anticoagulant or antiplatelet  ?                          agents. ASA Grade Assessment: II - A patient with  ?                          mild systemic disease. After reviewing the risks  ?                          and benefits, the patient was deemed in  ?                          satisfactory condition to undergo the procedure. ?                          After obtaining informed consent, the colonoscope  ?  was passed under direct vision. Throughout the  ?                          procedure, the patient's blood pressure, pulse, and  ?                          oxygen saturations were monitored continuously. The  ?                          Colonoscope was introduced through the anus and  ?                          advanced to the the terminal ileum, with  ?                          identification of the appendiceal  orifice and IC  ?                          valve. The colonoscopy was performed without  ?                          difficulty. The patient tolerated the procedure  ?                          well. The quality of the bowel preparation was  ?                          good. The terminal ileum, ileocecal valve,  ?                          appendiceal orifice, and rectum were photographed.  ?                          The bowel preparation used was SUPREP via split  ?                          dose instruction. ?Scope In: 2:56:11 PM ?Scope Out: 3:06:57 PM ?Scope Withdrawal Time: 0 hours 4 minutes 31 seconds  ?Total Procedure Duration: 0 hours 10 minutes 46 seconds  ?Findings:                 The perianal and digital rectal examinations were  ?                          normal. Pertinent negatives include normal  ?                          sphincter tone and no palpable rectal lesions. ?                          The colon (entire examined portion) appeared normal. ?                          The terminal ileum appeared normal. ?  The retroflexed view of the distal rectum and anal  ?                          verge was normal and showed no anal or rectal  ?                          abnormalities. ?Complications:            No immediate complications. ?Estimated Blood Loss:     Estimated blood loss: none. ?Impression:               - The entire examined colon is normal. ?                          - The examined portion of the ileum was normal. ?                          - The distal rectum and anal verge are normal on  ?                          retroflexion view. ?                          - No specimens collected. ?                          - No abnormalities to explain patient's abdominal  ?                          pain. ?Recommendation:           - Patient has a contact number available for  ?                          emergencies. The signs and symptoms of potential  ?                           delayed complications were discussed with the  ?                          patient. Return to normal activities tomorrow.  ?                          Written discharge instructions were provided to the  ?                          patient. ?                          - Resume previous diet. ?                          - Continue present medications. ?                          - Repeat colonoscopy in 10 years for screening  ?  purposes. ?Graylyn Bunney E. Candis Schatz, MD ?09/06/2021 3:17:50 PM ?This report has been signed electronically. ?

## 2021-09-06 NOTE — Progress Notes (Signed)
Report to PACU, RN, vss, BBS= Clear.  

## 2021-09-06 NOTE — Progress Notes (Signed)
Called to room to assist during endoscopic procedure.  Patient ID and intended procedure confirmed with present staff. Received instructions for my participation in the procedure from the performing physician.  

## 2021-09-06 NOTE — Progress Notes (Signed)
History and Physical Interval Note: ? ?09/06/2021 ?2:20 PM ? ?Kristen Santiago  has presented today for endoscopic procedure(s), with the diagnosis of No diagnosis found..  The various methods of evaluation and treatment have been discussed with the patient and/or family. After consideration of risks, benefits and other options for treatment, the patient has consented to  the endoscopic procedure(s). ? ? The patient's history has been reviewed, patient examined, no change in status, stable for endoscopic procedure(s).  I have reviewed the patient's chart and labs.  Questions were answered to the patient's satisfaction.   ? ? ?Reida Hem E. Candis Schatz, MD ?[facility] ? ?

## 2021-09-06 NOTE — Progress Notes (Signed)
No problems noted in the recovery room. maw 

## 2021-09-08 ENCOUNTER — Telehealth: Payer: Self-pay | Admitting: *Deleted

## 2021-09-08 NOTE — Telephone Encounter (Signed)
?  Follow up Call- ? ? ?  09/06/2021  ?  2:27 PM  ?Call back number  ?Post procedure Call Back phone  # [phone]  ?Permission to leave phone message Yes  ?  ? ?Patient questions: ? ?Do you have a fever, pain , or abdominal swelling? No. ?Pain Score  0 * ? ?Have you tolerated food without any problems? Yes.   ? ?Have you been able to return to your normal activities? Yes.   ? ?Do you have any questions about your discharge instructions: ?Diet   No. ?Medications  No. ?Follow up visit  No. ? ?Do you have questions or concerns about your Care? No. ? ?Actions: ?* If pain score is 4 or above: ?No action needed, pain <4. ? ? ?

## 2021-09-15 NOTE — Progress Notes (Signed)
Kristen Santiago ?The biopsies taken from your stomach were notable for mild chronic gastritis (inflammation) which is a common finding, often related to medications such as non-steroidal anti-inflammatory drugs, but there was no evidence of Helicobacter pylori infection. This common finding is not likely to explain abdominal pain and there is no specific treatment or further evaluation recommended. ?Please follow up with me in clinic as needed for ongoing management of your chronic GI symptoms. ?

## 2021-09-20 ENCOUNTER — Ambulatory Visit (INDEPENDENT_AMBULATORY_CARE_PROVIDER_SITE_OTHER): Payer: PRIVATE HEALTH INSURANCE | Admitting: Family

## 2021-09-20 ENCOUNTER — Encounter: Payer: Self-pay | Admitting: Family

## 2021-09-20 ENCOUNTER — Telehealth: Payer: Self-pay | Admitting: Family

## 2021-09-20 VITALS — BP 148/88 | HR 91 | Temp 98.0°F | Ht 62.0 in | Wt 162.1 lb

## 2021-09-20 DIAGNOSIS — F418 Other specified anxiety disorders: Secondary | ICD-10-CM

## 2021-09-20 DIAGNOSIS — I10 Essential (primary) hypertension: Secondary | ICD-10-CM

## 2021-09-20 NOTE — Progress Notes (Signed)
? ?Subjective:  ? ? ? Patient ID: Kristen Santiago, female    DOB: [birth year], 56 y.o.   MRN: [id number] ? ?Chief Complaint  ?Patient presents with  ? Anxiety  ?  Pt states she did not like the Hydroxyzine.   ? Hypertension  ?  Pt states her BP has been god at home 120/60's  ? ?HPI: ?Anxiety/Depression: Patient complains of anxiety disorder.   ?She has the following symptoms: difficulty concentrating, fatigue, irritable, palpitations, racing thoughts, sweating.  ?Onset of symptoms was approximately  months ago, She denies current suicidal and homicidal ideation.  ?Possible organic causes contributing are: none.  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  ?Previous treatment includes no meds or counseling. pt started on Hydroxyzine last visit, but didn't like how it made her feel. ?Hypertension: Patient is currently maintained on the following medications for blood pressure: Lisinopril ?Failed meds include: Lisinopril-HCTZ, Olmesartan, Spironolactone (no SE, just not needed) ?Patient reports good compliance with blood pressure medications. ?Patient denies chest pain, headaches, shortness of breath or swelling. ?Last 3 blood pressure readings in our office are as follows: ?BP Readings from Last 3 Encounters:  ?09/20/21 (!) 148/88  ?09/06/21 136/85  ?08/17/21 (!) 148/72  ?  ?Health Maintenance Due  ?Topic Date Due  ? HIV Screening  Never done  ? Hepatitis C Screening  Never done  ? Zoster Vaccines- Shingrix (1 of 2) 06/30/1983  ? ? ?Past Medical History:  ?Diagnosis Date  ? Anxiety   ? GERD (gastroesophageal reflux disease)   ? Hypertension   ? Weight increased 01/12/2021  ? ? ?Past Surgical History:  ?Procedure Laterality Date  ? ABDOMINAL HYSTERECTOMY    ? ANKLE SURGERY    ? TONSILLECTOMY    ? ? ?Outpatient Medications Prior to Visit  ?Medication Sig Dispense Refill  ? lisinopril (ZESTRIL) 30 MG tablet Take 1 tablet (30 mg total) by mouth daily. 30 tablet 2  ? magnesium oxide  (MAG-OX) 400 MG tablet Take 200 mg by mouth every evening.    ? dicyclomine (BENTYL) 20 MG tablet Take 1 tablet (20 mg total) by mouth every 6 (six) hours. 30 tablet 1  ? hydrOXYzine (ATARAX) 10 MG tablet Take 1 tablet (10 mg total) by mouth 3 (three) times daily as needed. (Patient not taking: Reported on 09/20/2021) 30 tablet 0  ? ?No facility-administered medications prior to visit.  ? ? ?Allergies  ?Allergen Reactions  ? Amlodipine   ?  Other reaction(s): Other  ? Bisoprolol   ?  Other reaction(s): Other  ? Escitalopram   ?  Other reaction(s): Other, Other (See Comments) ?Other reaction(s): Other, Other (See Comments) ?  ? Floxacillin [Floxacillin (Flucloxacillin)] Swelling  ? Hydrochlorothiazide   ? ?   ?Objective:  ?  ?Physical Exam ?Vitals and nursing note reviewed.  ?Constitutional:   ?   Appearance: Normal appearance.  ?Cardiovascular:  ?   Rate and Rhythm: Normal rate and regular rhythm.  ?Pulmonary:  ?   Effort: Pulmonary effort is normal.  ?   Breath sounds: Normal breath sounds.  ?Musculoskeletal:     ?   General: Normal range of motion.  ?Skin: ?   General: Skin is warm and dry.  ?Neurological:  ?   Mental Status: She is alert.  ?Psychiatric:     ?   Mood and Affect: Mood normal.     ?   Behavior: Behavior normal.  ? ? ?BP (!) 148/88 (BP Location: Left  Arm, Patient Position: Sitting, Cuff Size: Large) Comment: 157/91  Pulse 91   Temp 98 ?F (36.7 ?C) (Temporal)   Ht 5\' 2"  (1.575 m)   Wt 162 lb 2 oz (73.5 kg)   SpO2 100%   BMI 29.65 kg/m?  ?Wt Readings from Last 3 Encounters:  ?09/20/21 162 lb 2 oz (73.5 kg)  ?09/06/21 162 lb (73.5 kg)  ?08/17/21 164 lb 2 oz (74.4 kg)  ? ?   ?Assessment & Plan:  ? ?Problem List Items Addressed This Visit   ? ?  ? Cardiovascular and Mediastinum  ? Chronic hypertension - Primary  ?  seen by Cardiology for frequent PVCs  - spironolactone stopped and started on magnesium oxide. BP still elevated today, but reports at their office, at GI office and at home it has been  normal. Looking at chart, however, at cardiology BP was 170/113, given clonidine. Will continue Lisinopril current dose, pt has f/u with Cardiology in June. Continue to advise on low sodium diet and at least 2 liter water intake qd. ?  ?  ?  ? Other  ? Situational anxiety  ?  pt did not like how the Hydroxyzine made her feel. She has been taking Mag Ox given by Cardiology and thinks this is also helping her anxiety. She is taking 1/2 of 400mg  at night, advised she could increase to 1/2 pill in am and pm and see how she does. f/u prn. ?  ?  ? ? ?July, NP ? ?

## 2021-09-20 NOTE — Assessment & Plan Note (Signed)
pt did not like how the Hydroxyzine made her feel. She has been taking Mag Ox given by Cardiology and thinks this is also helping her anxiety. She is taking 1/2 of 400mg  at night, advised she could increase to 1/2 pill in am and pm and see how she does. f/u prn. ?

## 2021-09-20 NOTE — Assessment & Plan Note (Addendum)
seen by Cardiology for frequent PVCs  - spironolactone stopped and started on magnesium oxide. BP still elevated today, but reports at their office, at GI office and at home it has been normal. Looking at chart, however, at cardiology BP was 170/113, given clonidine. Will continue Lisinopril current dose, pt has f/u with Cardiology in June. Continue to advise on low sodium diet and at least 2 liter water intake qd. ?

## 2021-09-20 NOTE — Patient Instructions (Addendum)
It was very nice to see you today! ? ?Continue the Lisinopril for your blood pressure. Follow up with Cardiology for the premature beats in your heart rhythm. ?Continue to drink at least 2 liters of water daily and eat a low sodium diet. ?Try 1/2 pill of your Magnesium oxide twice a day to help with anxiety and blood pressure. If your bowels are too loose, then back off to 1/2 pill daily.  ? ? ? ?PLEASE NOTE: ? ?If you had any lab tests please let us know if you have not heard back within a few days. You may see your results on MyChart before we have a chance to review them but we will give you a call once they are reviewed by [place]. If we ordered any referrals today, please let us know if you have not heard from their office within the next week.  ? ?Please try these tips to maintain a healthy lifestyle: ? ?Eat most of your calories during the day when you are active. Eliminate processed foods including packaged sweets (pies, cakes, cookies), reduce intake of potatoes, white bread, white pasta, and white rice. Look for whole grain options, oat flour or almond flour. ? ?Each meal should contain half fruits/vegetables, one quarter protein, and one quarter carbs (no bigger than a computer mouse). ? ?Cut down on sweet beverages. This includes juice, soda, and sweet tea. Also watch fruit intake, though this is a healthier sweet option, it still contains natural sugar! Limit to 3 servings daily. ? ?Drink at least 1 glass of water with each meal and aim for at least 8 glasses per day ? ?Exercise at least 150 minutes every week.  ? ?

## 2021-09-20 NOTE — Telephone Encounter (Signed)
Just an FYI, pt was upset when she called in. She stated her previous BP results have been in the normal range with her cardiologist and gastro. She is asking for [place] to check those readings. She was also very upset that the first reading was done electronically. She stated she just felt uncomfortable through the whole visit. ?

## 2021-09-27 ENCOUNTER — Encounter: Payer: Self-pay | Admitting: Family

## 2021-09-27 DIAGNOSIS — F418 Other specified anxiety disorders: Secondary | ICD-10-CM

## 2021-09-27 DIAGNOSIS — I10 Essential (primary) hypertension: Secondary | ICD-10-CM

## 2021-09-28 ENCOUNTER — Other Ambulatory Visit: Payer: Self-pay

## 2021-09-28 DIAGNOSIS — I10 Essential (primary) hypertension: Secondary | ICD-10-CM

## 2021-09-28 MED ORDER — LISINOPRIL 40 MG PO TABS: 40.0000 mg | ORAL_TABLET | Freq: Every day | ORAL | 1 refills | Status: DC

## 2021-10-23 MED ORDER — LISINOPRIL 30 MG PO TABS: 30.0000 mg | ORAL_TABLET | Freq: Every day | ORAL | 2 refills | Status: DC

## 2021-10-23 MED ORDER — HYDROXYZINE HCL 10 MG PO TABS: 10.0000 mg | ORAL_TABLET | Freq: Three times a day (TID) | ORAL | 2 refills | Status: DC | PRN

## 2021-11-13 ENCOUNTER — Ambulatory Visit (INDEPENDENT_AMBULATORY_CARE_PROVIDER_SITE_OTHER): Payer: PRIVATE HEALTH INSURANCE | Admitting: Family

## 2021-11-13 ENCOUNTER — Encounter: Payer: Self-pay | Admitting: Family

## 2021-11-13 VITALS — BP 153/80 | HR 92 | Temp 97.7°F | Ht 62.0 in | Wt 161.4 lb

## 2021-11-13 DIAGNOSIS — I10 Essential (primary) hypertension: Secondary | ICD-10-CM | POA: Diagnosis not present

## 2021-11-13 MED ORDER — CHLORTHALIDONE 15 MG PO TABS: 15.0000 mg | ORAL_TABLET | Freq: Every morning | ORAL | 0 refills | Status: DC

## 2021-11-13 MED ORDER — AMLODIPINE BESYLATE 5 MG PO TABS: 5.0000 mg | ORAL_TABLET | Freq: Every day | ORAL | 0 refills | Status: DC

## 2021-11-13 NOTE — Progress Notes (Signed)
Subjective:     Patient ID: Kristen Santiago, female    DOB: 10/14/1965, 56 y.o.   MRN: [id number]  Chief Complaint  Patient presents with   Medication Reaction    Pt c/o Dizziness and loopy feeling with Lisinopril , Pt states she takes her medication at night but around 8 or 9 In the morning she starts to feel dizzy and is not able to drive. Pt took Lisinopril whole last night and half of 30 at 8am this morning.   HPI: Hypertension: Patient is currently maintained on the following medications for blood pressure: Lisinopril Failed meds include: Lisinopril-HCTZ, Olmesartan (stomach cramps), Spironolactone (no SE, just not needed) Patient reports good compliance with blood pressure medications. Patient denies chest pain, headaches, shortness of breath or swelling.  Assessment & Plan:   Problem List Items Addressed This Visit       Cardiovascular and Mediastinum   Chronic hypertension - Primary    Chronic - after adjusting Lisinopril doses to control sx and keep BP under control, pt states she wants to switch meds as she is feeling too "loopy" no matter when she takes the medicine. Discussed other med classes and she would like to try Amlodipine again, as she does not remember why she could not tolerate in past. Will also start chlorthalidone (previously did not tolerate HCTZ & her mother tolerates Chlorthalidone) to offset any edema and assist in lowering BP.  f/u in 1 mo.       Relevant Medications   amLODipine (NORVASC) 5 MG tablet   chlorthalidone (HYGROTEN) 15 MG tablet    Outpatient Medications Prior to Visit  Medication Sig Dispense Refill   hydrOXYzine (ATARAX) 10 MG tablet Take 1 tablet (10 mg total) by mouth 3 (three) times daily as needed. 30 tablet 2   lisinopril (ZESTRIL) 30 MG tablet Take 1 tablet (30 mg total) by mouth daily. 30 tablet 2   magnesium oxide (MAG-OX) 400 MG tablet Take 200 mg by mouth every evening.     magnesium oxide (MAG-OX) 400 MG tablet Take by  mouth.     No facility-administered medications prior to visit.    Past Medical History:  Diagnosis Date   Anxiety    GERD (gastroesophageal reflux disease)    Weight increased 01/12/2021    Past Surgical History:  Procedure Laterality Date   ABDOMINAL HYSTERECTOMY     ANKLE SURGERY     TONSILLECTOMY      Allergies  Allergen Reactions   Amlodipine     Other reaction(s): Other   Bisoprolol     Other reaction(s): Other   Escitalopram     Other reaction(s): Other, Other (See Comments) Other reaction(s): Other, Other (See Comments)    Floxacillin [Floxacillin (Flucloxacillin)] Swelling   Hydrochlorothiazide        Objective:    Physical Exam Vitals and nursing note reviewed.  Constitutional:      Appearance: Normal appearance.  Cardiovascular:     Rate and Rhythm: Normal rate and regular rhythm.  Pulmonary:     Effort: Pulmonary effort is normal.     Breath sounds: Normal breath sounds.  Musculoskeletal:        General: Normal range of motion.  Skin:    General: Skin is warm and dry.  Neurological:     Mental Status: She is alert.  Psychiatric:        Mood and Affect: Mood normal.        Behavior: Behavior normal.    BP Marland Kitchen)  153/80 (BP Location: Left Arm, Patient Position: Sitting, Cuff Size: Large)   Pulse 92   Temp 97.7 F (36.5 C) (Temporal)   Ht 5\' 2"  (1.575 m)   Wt 161 lb 6 oz (73.2 kg)   SpO2 100%   BMI 29.52 kg/m  Wt Readings from Last 3 Encounters:  11/13/21 161 lb 6 oz (73.2 kg)  09/20/21 162 lb 2 oz (73.5 kg)  09/06/21 162 lb (73.5 kg)        Meds ordered this encounter  Medications   amLODipine (NORVASC) 5 MG tablet    Sig: Take 1 tablet (5 mg total) by mouth daily.    Dispense:  30 tablet    Refill:  0    D/C Lisinopril    Order Specific Question:   Supervising Provider    Answer:   ANDY, CAMILLE L [[id number]]   chlorthalidone (HYGROTEN) 15 MG tablet    Sig: Take 1 tablet (15 mg total) by mouth in the morning. OK to start with 1/2  pill at first for several days, if tolerated increase to 1 full pill.    Dispense:  30 tablet    Refill:  0    Order Specific Question:   Supervising Provider    Answer:   ANDY, CAMILLE L [[id number]]    09/08/21, NP

## 2021-11-13 NOTE — Assessment & Plan Note (Signed)
Chronic - after adjusting Lisinopril doses to control sx and keep BP under control, pt states she wants to switch meds as she is feeling too "loopy" no matter when she takes the medicine. Discussed other med classes and she would like to try Amlodipine again, as she does not remember why she could not tolerate in past. Will also start chlorthalidone (previously did not tolerate HCTZ & her mother tolerates Chlorthalidone) to offset any edema and assist in lowering BP.  f/u in 1 mo.

## 2021-11-26 ENCOUNTER — Other Ambulatory Visit: Payer: Self-pay | Admitting: Family

## 2021-11-26 DIAGNOSIS — I10 Essential (primary) hypertension: Secondary | ICD-10-CM

## 2022-01-02 ENCOUNTER — Ambulatory Visit (INDEPENDENT_AMBULATORY_CARE_PROVIDER_SITE_OTHER): Payer: PRIVATE HEALTH INSURANCE | Admitting: Family

## 2022-01-02 ENCOUNTER — Encounter: Payer: Self-pay | Admitting: Family

## 2022-01-02 DIAGNOSIS — I10 Essential (primary) hypertension: Secondary | ICD-10-CM

## 2022-01-02 LAB — LIPID PANEL
Cholesterol: 255 mg/dL — ABNORMAL HIGH (ref 0–200)
HDL: 88.7 mg/dL (ref >39.00)
LDL Cholesterol: 144 mg/dL — ABNORMAL HIGH (ref 0–99)
NonHDL: 166.39
Total CHOL/HDL Ratio: 3
Triglycerides: 111 mg/dL (ref 0.0–149.0)
VLDL: 22.2 mg/dL (ref 0.0–40.0)

## 2022-01-02 LAB — COMPREHENSIVE METABOLIC PANEL
ALT: 20 U/L (ref 0–35)
AST: 23 U/L (ref 0–37)
Albumin: 4.5 g/dL (ref 3.5–5.2)
Alkaline Phosphatase: 74 U/L (ref 39–117)
BUN: 17 mg/dL (ref 6–23)
CO2: 31 mEq/L (ref 19–32)
Calcium: 10.5 mg/dL (ref 8.4–10.5)
Chloride: 100 mEq/L (ref 96–112)
Creatinine, Ser: 0.9 mg/dL (ref 0.40–1.20)
GFR: 70.96 mL/min (ref >60.00)
Glucose, Bld: 90 mg/dL (ref 70–99)
Potassium: 4.3 mEq/L (ref 3.5–5.1)
Sodium: 138 mEq/L (ref 135–145)
Total Bilirubin: 0.4 mg/dL (ref 0.2–1.2)
Total Protein: 7.7 g/dL (ref 6.0–8.3)

## 2022-01-02 MED ORDER — LOSARTAN POTASSIUM 25 MG PO TABS
25.0000 mg | ORAL_TABLET | Freq: Every morning | ORAL | 2 refills | Status: DC
Start: 2022-01-02 — End: 2022-03-06

## 2022-01-02 NOTE — Patient Instructions (Addendum)
It was very nice to see you today!   I will review your lab results via MyChart in a few days.  Take a full Amlodipine pill in the evening or at bedtime. Stop the Chlorthalidone. Check your BP in the morning after breakfast and after lunch.  Start low dose Losartan in am if your BP readings are running higher than 125/90  (either number)  Continue to drink at least 2 liters of water daily = 64oz = 8 cups.  Try to increase exercise where able, getting your heart rate over 120 beats per minute for 15-64minutes.  Follow up in 2-3 months as your schedule allows.    PLEASE NOTE:  If you had any lab tests please let us know if you have not heard back within a few days. You may see your results on MyChart before we have a chance to review them but we will give you a call once they are reviewed by [place]. If we ordered any referrals today, please let us know if you have not heard from their office within the next week.

## 2022-01-02 NOTE — Assessment & Plan Note (Addendum)
   chronic  taking amlodipine at night 1/2 pill and chlorthalidone in am, but complains of dizziness later in day, also having HA that she says started after chlorthalidone  advised to stop chlorthalidone  Advised to take 1 full pill of Amlodipine at night, continue to check BP for several days  if BP >125/90, take Losartan 25mg , new RX sent to pharmacy, advised of use & SE.   Let me know if continuing to see high readings at home  f/u in 2-3 mos.

## 2022-01-02 NOTE — Progress Notes (Signed)
Patient ID: Kristen Santiago, female    DOB: [birth year], 56 y.o.   MRN: [id number]  Chief Complaint  Patient presents with   Hypertension    Follow up W/ Labs    HPI: Hypertension: Patient is currently maintained on the following medications for blood pressure: Amlodipine, Chlorthalidone; Patient reports good compliance with blood pressure medications. Patient denies chest pain, shortness of breath or swelling. Reports dizziness and headaches. Last 3 blood pressure readings in our office are as follows: BP Readings from Last 3 Encounters:  01/02/22 (!) 154/84  11/13/21 (!) 153/80  09/20/21 (!) 148/88    Assessment & Plan:   Problem List Items Addressed This Visit       Cardiovascular and Mediastinum   Chronic hypertension    chronic taking amlodipine at night 1/2 pill and chlorthalidone in am, but complains of dizziness later in day, also having HA that she says started after chlorthalidone advised to stop chlorthalidone Advised to take 1 full pill of Amlodipine at night, continue to check BP for several days if BP >125/90, take Losartan $RemoveBefor'25mg'[id number]$ , new RX sent to pharmacy, advised of use & SE.  Let me know if continuing to see high readings at home f/u in 2-3 mos.      Relevant Medications   losartan (COZAAR) 25 MG tablet   Other Relevant Orders   Comp Met (CMET) (Completed)   Lipid panel (Completed)    Subjective:    Outpatient Medications Prior to Visit  Medication Sig Dispense Refill   amLODipine (NORVASC) 5 MG tablet Take 1 tablet (5 mg total) by mouth daily. 30 tablet 0   magnesium oxide (MAG-OX) 400 MG tablet Take 200 mg by mouth every evening.     chlorthalidone (HYGROTON) 25 MG tablet Take 1 tablet (25 mg total) by mouth daily. Start with 1/2 pill for several days, then increase to 1 full pill qam if tolerated. 30 tablet 2   hydrOXYzine (ATARAX) 10 MG tablet Take 1 tablet (10 mg total) by mouth 3 (three) times daily as needed. (Patient not taking: Reported on  01/02/2022) 30 tablet 2   lisinopril (ZESTRIL) 30 MG tablet Take 1 tablet (30 mg total) by mouth daily. (Patient not taking: Reported on 01/02/2022) 30 tablet 2   No facility-administered medications prior to visit.   Past Medical History:  Diagnosis Date   Anxiety    GERD (gastroesophageal reflux disease)    Weight increased 01/12/2021   Past Surgical History:  Procedure Laterality Date   ABDOMINAL HYSTERECTOMY     ANKLE SURGERY     TONSILLECTOMY     Allergies  Allergen Reactions   Amlodipine     Other reaction(s): Other   Bisoprolol     Other reaction(s): Other   Escitalopram     Other reaction(s): Other, Other (See Comments) Other reaction(s): Other, Other (See Comments)    Floxacillin [Floxacillin (Flucloxacillin)] Swelling   Hydrochlorothiazide       Objective:    Physical Exam BP (!) 154/84 (BP Location: Left Arm, Patient Position: Sitting, Cuff Size: Large)   Pulse 95   Temp 98 F (36.7 C) (Temporal)   Ht $R'5\' 2"'LL$  (1.575 m)   Wt 160 lb 8 oz (72.8 kg)   SpO2 100%   BMI 29.36 kg/m  Wt Readings from Last 3 Encounters:  01/02/22 160 lb 8 oz (72.8 kg)  11/13/21 161 lb 6 oz (73.2 kg)  09/20/21 162 lb 2 oz (73.5 kg)       Modene Andy,  NP

## 2022-01-05 ENCOUNTER — Telehealth: Payer: Self-pay | Admitting: Family

## 2022-01-05 ENCOUNTER — Encounter: Payer: Self-pay | Admitting: Family

## 2022-01-05 DIAGNOSIS — E782 Mixed hyperlipidemia: Secondary | ICD-10-CM

## 2022-01-05 NOTE — Progress Notes (Signed)
Your glucose, electrolytes, liver & kidney function are all normal.  Your total cholesterol number & LDL (bad #) are high. The American Heart association does a risk score for chance of a cardiovascular event in the next 10 yrs. It is  based on age, Blood pressure, cholesterol, race, and other factors. A score greater than 5% they recommend treating with a cholesterol lowering medication. Your score is 8.6%. I do recommend starting a low dose at least weekly to see if this helps lower your numbers a little along with diet changes: Need to reduce any fried foods, alcohol, nonnutritional snacks e.g. chips/cookies,pies, cakes and candies, fatty meat (red meat), high fat dairy foods:  including cheese, milk, ice cream.  Increase fruits/vegetables/fiber.   Continue or restart an exercise routine, shooting for 5-7days per week.   Let me know if you want to start the medication. I typically prescribe generic Crestor.  Have a great weekend!

## 2022-01-05 NOTE — Telephone Encounter (Signed)
-----   Message -----      From:Zalea Amie Critchley      Sent:01/05/2022  1:00 PM EDT        OM:[id number] Appointment Schedule Request Message List   Subject:Appointment Scheduled  Hello Judeth Cornfield  I saw the results when they arrived and knew you would recommend med. I have to admit I have been eating sweeties for summer birthdays.   Yes please submit the med for Crestor. My bp have been ok. I have to pick up the new addition of Losortan.  I started the exercise regimen also. It's going to get better.   Thank you  Adamariz

## 2022-01-07 MED ORDER — ROSUVASTATIN CALCIUM 5 MG PO TABS: 5.0000 mg | ORAL_TABLET | Freq: Every day | ORAL | 1 refills | Status: DC

## 2022-02-06 ENCOUNTER — Telehealth: Payer: Self-pay | Admitting: Family

## 2022-02-06 DIAGNOSIS — I10 Essential (primary) hypertension: Secondary | ICD-10-CM

## 2022-02-06 DIAGNOSIS — E782 Mixed hyperlipidemia: Secondary | ICD-10-CM

## 2022-02-06 NOTE — Telephone Encounter (Signed)
Please see message and advise if okay to send referral?

## 2022-02-06 NOTE — Telephone Encounter (Signed)
Patient requests to be Referred to Dr. Elmarie Shiley [place]-Cardiologist- located at [facility], [facility] to manage Patient's blood pressure and cholesterol.

## 2022-02-06 NOTE — Telephone Encounter (Signed)
Yes.  Thank you.

## 2022-02-16 ENCOUNTER — Other Ambulatory Visit: Payer: Self-pay | Admitting: Obstetrics and Gynecology

## 2022-02-16 DIAGNOSIS — Z1231 Encounter for screening mammogram for malignant neoplasm of breast: Secondary | ICD-10-CM

## 2022-03-05 ENCOUNTER — Encounter: Payer: Self-pay | Admitting: *Deleted

## 2022-03-06 ENCOUNTER — Ambulatory Visit (HOSPITAL_BASED_OUTPATIENT_CLINIC_OR_DEPARTMENT_OTHER): Payer: BC Managed Care – PPO | Admitting: Cardiovascular Disease

## 2022-03-06 ENCOUNTER — Encounter (HOSPITAL_BASED_OUTPATIENT_CLINIC_OR_DEPARTMENT_OTHER): Payer: Self-pay | Admitting: Cardiovascular Disease

## 2022-03-06 DIAGNOSIS — I10 Essential (primary) hypertension: Secondary | ICD-10-CM

## 2022-03-06 DIAGNOSIS — I493 Ventricular premature depolarization: Secondary | ICD-10-CM | POA: Diagnosis not present

## 2022-03-06 DIAGNOSIS — E782 Mixed hyperlipidemia: Secondary | ICD-10-CM

## 2022-03-06 MED ORDER — OLMESARTAN MEDOXOMIL 40 MG PO TABS: 40.0000 mg | ORAL_TABLET | Freq: Every day | ORAL | 3 refills | Status: DC

## 2022-03-06 NOTE — Assessment & Plan Note (Addendum)
ASCVD 10 year risk 7.6%.  She wonders what would be best for her lipid management.  Keep working on diet and exercise.  We will also get a coronary calcium score to better guide our recommendations.

## 2022-03-06 NOTE — Progress Notes (Signed)
Cardiology Office Note:    Date:  03/06/2022   ID:  Kristen Santiago, DOB 1965/07/24, MRN [id number]  PCP:  Merryl Hacker No   [facility] Providers Cardiologist:  Skeet Latch, MD     Referring MD: Jeanie Sewer, NP   No chief complaint on file.   History of Present Illness:    Kristen Santiago is a 56 y.o. female with a hx of hyperlipidemia, PVC's, fatty liver disease,  here for uncontrolled hypertension. She saw her PCP on 12/2021 and complained of dizziness. She also had headaches after chlorthalidone was added to her regimen. She was instructed to add losartan if her BP was over 125/90 and referred to cardiology. Her blood pressure in the office was 154/84.  Today, she states that she has moved to the area from [place], Previously she was taking lisinopril-HCTZ, but she began having indigestion and was "not agreeing with her." She was started on 25 mg olmesartan which was helping with no headaches or dizziness. At one time she was started on spironolactone, but she is not certain why. She had experienced palpitations so she was prescribed metoprolol. This was later switched to magnesium. In the mornings, her blood pressure is around [id number] systolic. After her morning routine with walking the dogs, her blood pressure decreases to [id number] systolic. Later in the day it may be as low as [id number] systolic. She complains of headaches which she attributes to losartan. For exercise she enjoys walking her dogs 3 times a day for 15-20 minutes. Previously she was jogging up to 2 miles; in the last 8 months she has not been jogging consistently due to hip pain. She endorses a muscle knot in her mid-back, and she developed misaligned hips. She has seen a chiropractor which helped alleviate her pain. While walking she may have LE pain/numbness in her toes that she attributes to bilateral bunionectomies. Regarding her diet she mainly eats baked chicken. When she does order out, she is typically selective and  monitors her salt intake. She has stopped drinking caffeine and does not consume alcohol. Also she is taking garlic and [id number] supplements. In July her LDL was 144. She denies any chest pain, shortness of breath, or peripheral edema. No syncope, orthopnea, or PND.  Prior antihypertensives: Lisinopril/HCTZ Amlodipine Bisoprolol HCTZ Olmesartan- did well on this Metoprolol Spironolactone-no clear adverse effects   Past Medical History:  Diagnosis Date   Anxiety    GERD (gastroesophageal reflux disease)    Weight increased 01/12/2021    Past Surgical History:  Procedure Laterality Date   ABDOMINAL HYSTERECTOMY     ANKLE SURGERY     TONSILLECTOMY      Current Medications: Current Meds  Medication Sig   magnesium oxide (MAG-OX) 400 MG tablet Take 200 mg by mouth every evening.   olmesartan (BENICAR) 40 MG tablet Take 1 tablet (40 mg total) by mouth daily.   [DISCONTINUED] losartan (COZAAR) 25 MG tablet Take 12.5 mg by mouth at bedtime.     Allergies:   Amlodipine, Bisoprolol, Escitalopram, Floxacillin [floxacillin (flucloxacillin)], and Hydrochlorothiazide   Social History   Socioeconomic History   Marital status: Single    Spouse name: Not on file   Number of children: Not on file   Years of education: Not on file   Highest education level: Not on file  Occupational History   Not on file  Tobacco Use   Smoking status: Never   Smokeless tobacco: Never  Vaping Use   Vaping Use: Never used  Substance and  Sexual Activity   Alcohol use: Not Currently   Drug use: Never   Sexual activity: Never    Birth control/protection: None  Other Topics Concern   Not on file  Social History Narrative   Not on file   Social Determinants of Health   Financial Resource Strain: Low Risk  (03/06/2022)   Overall Financial Resource Strain (CARDIA)    Difficulty of Paying Living Expenses: Not hard at all  Food Insecurity: No Food Insecurity (03/06/2022)   Hunger Vital Sign    Worried  About Running Out of Food in the Last Year: Never true    Ran Out of Food in the Last Year: Never true  Transportation Needs: No Transportation Needs (03/06/2022)   PRAPARE - Hydrologist (Medical): No    Lack of Transportation (Non-Medical): No  Physical Activity: Insufficiently Active (03/06/2022)   Exercise Vital Sign    Days of Exercise per Week: 2 days    Minutes of Exercise per Session: 30 min  Stress: Not on file  Social Connections: Not on file     Family History: The patient's family history includes Hypertension in her brother, father, mother, niece, and sister.  ROS:   Please see the history of present illness.    (+) Headaches (+) Arthralgias (+) Numbness of bilateral toes All other systems reviewed and are negative.  EKGs/Labs/Other Studies Reviewed:    The following studies were reviewed today:  Monitor 11/2021  ([facility]): Patient was wearing extended 3 day Holter monitor starting on Oct 25, 2021.   Indication: PVCs.   Patient remained in sinus rhythm with minimum HR 61 at 5:33 AM on May 18, max HR 142 at 12:57 PM on May 19.  Average HR 87.   PVCs noted representing  5.3% of total beats. 192 couplets of PVCs noted.  250 PVCs noted in bigeminal pattern. Almost 7,400 PVCs noted in trigeminal pattern representing about 2% of total beats.   Only 23 supraventricular ectopic beats noted representing  <1% of total beats.   2 runs of narrow complex tachycardia noted. Patient remained asymptomatic during these episodes.  The fastest run was 4 beat run with HR 133 at 3:25 AM on May 18.  The longest run 16 beat run with HR 119 at 3:25 AM on May 18.   Np patient triggered episodes were recorded.     No data is available how long the patient was tachycardic or bradycardic.   The longest R to R interval 1 second at 5:33 AM on May 18.   No A. fib, no VT, no long pauses 3 seconds or more noted.  Echo  10/23/2021  ([facility]): Left Ventricle:  Systolic function is normal. EF: 55-59%.    Left Ventricle: Wall motion is normal.    Left Ventricle: Doppler parameters indicate normal diastolic function.    Aortic Valve: Trace aortic valve regurgitation.    Tricuspid Valve: The right ventricular systolic pressure is normal (<36  mmHg).  Exercise Stress Test  12/27/2020  ([facility]): Left Ventricle: Systolic function is normal. EF: 60%.    Left Ventricle: Wall motion is normal.    Left Ventricle: Doppler parameters are indeterminate for diastolic  function.    Tricuspid Valve: Unable to assess RVSP accurately without IVC  visualization but considering TR jet velocity only 1.3 m/s, RVSP is likely  within normal limits.    Post-stress: The left ventricle systolic function is hyperdynamic  post-stress with an EF of 70 - 72 %.  Post-stress: The post-stress echo showed normal wall motion which was  hyperdynamic compared to baseline.    Stress ECG: ECG Conclusion:  No ischemic ST segment changes occurred  with stress in the first 4 minutes in recovery.  At 5-minute in recovery  slight nonspecific 0.5 mm ST segment changes noted as above. No chest  pain. Normal functional capacity for age and gender with estimated  workload 7.2 METS.    Stress ECG: No ischemic ST segment changes occurred during exercise and  the first 4 minutes in recovery.  At 5-minute in recovery 0.5 mm flat ST  depression noted in leads III, aVF, V4, V5 and V6.    Stress ECG: No arrhythmias noted in the first 2 minutes in recovery.    At 3 minutes in recovery frequent PVCs noted some PVCs in trigeminal  pattern on and off up to 15 minutes in recovery.  At 16 minutes in  recovery PVCs got resolved.  Patient denied any palpitations, dizziness,  near-syncope or syncope.  Patient has not taken bisoprolol this morning  because of upcoming stress test.  She will take bisoprolol when she gets  home after stress test in 20 minutes as per patient.    Post-stress Impression: The  study is normal.    Post-stress Impression: This study shows a low prognostic risk.   No significant valvular disease noted.  EKG:   EKG is personally reviewed. 03/06/2022: Sinus rhythm. Rate 83 bpm.  Recent Labs: 08/05/2021: Hemoglobin 14.2; Platelets 299 01/02/2022: ALT 20; BUN 17; Creatinine, Ser 0.90; Potassium 4.3; Sodium 138   Recent Lipid Panel    Component Value Date/Time   CHOL 255 (H) 01/02/2022 0958   TRIG 111.0 01/02/2022 0958   HDL 88.70 01/02/2022 0958   CHOLHDL 3 01/02/2022 0958   VLDL 22.2 01/02/2022 0958   LDLCALC 144 (H) 01/02/2022 0958     Risk Assessment/Calculations:          HYPERTENSION CONTROL Vitals:   03/06/22 0852 03/06/22 0902 03/06/22 0936  BP: (!) 163/87 (!) 151/86 (!) 148/82    The patient's blood pressure is elevated above target today.  In order to address the patient's elevated BP: A new medication was prescribed today.     Physical Exam:    Wt Readings from Last 3 Encounters:  03/06/22 161 lb 9.6 oz (73.3 kg)  01/02/22 160 lb 8 oz (72.8 kg)  11/13/21 161 lb 6 oz (73.2 kg)     VS:  BP (!) 148/82 (BP Location: Left Arm, Patient Position: Sitting, Cuff Size: Large)   Pulse 83   Ht 5' 2.5" (1.588 m)   Wt 161 lb 9.6 oz (73.3 kg)   BMI 29.09 kg/m  , BMI Body mass index is 29.09 kg/m. GENERAL:  Well appearing HEENT: Pupils equal round and reactive, fundi not visualized, oral mucosa unremarkable NECK:  No jugular venous distention, waveform within normal limits, carotid upstroke brisk and symmetric, no bruits, no thyromegaly LUNGS:  Clear to auscultation bilaterally HEART:  RRR with one PVC.  PMI not displaced or sustained,S1 and S2 within normal limits, no S3, no S4, no clicks, no rubs, no murmurs ABD:  Flat, positive bowel sounds normal in frequency in pitch, no bruits, no rebound, no guarding, no midline pulsatile mass, no hepatomegaly, no splenomegaly EXT:  2 plus pulses throughout, no edema, no cyanosis no clubbing SKIN:  No  rashes no nodules NEURO:  Cranial nerves II through XII grossly intact, motor grossly intact throughout PSYCH:  Cognitively  intact, oriented to person place and time   ASSESSMENT:    1. Chronic hypertension   2. Frequent PVCs   3. Mixed dyslipidemia    PLAN:    Chronic hypertension Blood pressure uncontrolled.  BP has been 130s-150s.  BP 110s while at work.  She hasn't tolerated multiple medications.  She did well on olmesartan.  We will stop losartan and start olmesartan 20mg  daily.  Check BMP in a week.  She is doing a good job of walking 15-20 minutes three times per day.  She is no longer jogging.  Limited salt, caffeine, no EtOH.    Frequent PVCs Improved.  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   Mixed dyslipidemia ASCVD 10 year risk 7.6%.  She wonders what would be best for her lipid management.  Keep working on diet and exercise.  We will also get a coronary calcium score to better guide our recommendations.        Disposition: FU with APP/PharmD in 1 month for the next 3 months. FU with Jenay Morici C. Oval Linsey, MD, [facility] in 4 months.  Medication Adjustments/Labs and Tests Ordered: Current medicines are reviewed at length with the patient today.  Concerns regarding medicines are outlined above.   Orders Placed This Encounter  Procedures   CT CARDIAC SCORING (SELF PAY ONLY)   Basic metabolic panel   EKG [id number]   Meds ordered this encounter  Medications   olmesartan (BENICAR) 40 MG tablet    Sig: Take 1 tablet (40 mg total) by mouth daily.    Dispense:  90 tablet    Refill:  3    D/C LOSARTAN   Patient Instructions  Medication Instructions:  STOP LOSARTAN   START OLMESARTAN 40 MG DAILY    Labwork: BMET IN 1 WEEK    Testing/Procedures: CALCIUM SCORE-THIS WILL COST YOU $99 OUT OF POCKET    Follow-Up: 1 MONTH WITH PHARM D    Special Instructions:  MONITOR YOUR BLOOD PRESSURE TWICE A DAY, LOG IN THE BOOK PROVIDED. BRING THE  BOOK AND YOUR BLOOD PRESSURE MACHINE TO YOUR FOLLOW UP IN 1 MONTH   DASH Eating Plan DASH stands for "Dietary Approaches to Stop Hypertension." The DASH eating plan is a healthy eating plan that has been shown to reduce high blood pressure (hypertension). It may also reduce your risk for type 2 diabetes, heart disease, and stroke. The DASH eating plan may also help with weight loss. What are tips for following this plan?  General guidelines Avoid eating more than 2,300 mg (milligrams) of salt (sodium) a day. If you have hypertension, you may need to reduce your sodium intake to 1,500 mg a day. Limit alcohol intake to no more than 1 drink a day for nonpregnant women and 2 drinks a day for men. One drink equals 12 oz of beer, 5 oz of wine, or 1 oz of hard liquor. Work with your health care provider to maintain a healthy body weight or to lose weight. Ask what an ideal weight is for you. Get at least 30 minutes of exercise that causes your heart to beat faster (aerobic exercise) most days of the week. Activities may include walking, swimming, or biking. Work with your health care provider or diet and nutrition specialist (dietitian) to adjust your eating plan to your individual calorie needs. Reading food labels  Check food labels for the amount of sodium per serving. Choose foods with less than 5 percent of the  Daily Value of sodium. Generally, foods with less than 300 mg of sodium per serving fit into this eating plan. To find whole grains, look for the word "whole" as the first word in the ingredient list. Shopping Buy products labeled as "low-sodium" or "no salt added." Buy fresh foods. Avoid canned foods and premade or frozen meals. Cooking Avoid adding salt when cooking. Use salt-free seasonings or herbs instead of table salt or sea salt. Check with your health care provider or pharmacist before using salt substitutes. Do not fry foods. Cook foods using healthy methods such as baking,  boiling, grilling, and broiling instead. Cook with heart-healthy oils, such as olive, canola, soybean, or sunflower oil. Meal planning Eat a balanced diet that includes: 5 or more servings of fruits and vegetables each day. At each meal, try to fill half of your plate with fruits and vegetables. Up to 6-8 servings of whole grains each day. Less than 6 oz of lean meat, poultry, or fish each day. A 3-oz serving of meat is about the same size as a deck of cards. One egg equals 1 oz. 2 servings of low-fat dairy each day. A serving of nuts, seeds, or beans 5 times each week. Heart-healthy fats. Healthy fats called Omega-3 fatty acids are found in foods such as flaxseeds and coldwater fish, like sardines, salmon, and mackerel. Limit how much you eat of the following: Canned or prepackaged foods. Food that is high in trans fat, such as fried foods. Food that is high in saturated fat, such as fatty meat. Sweets, desserts, sugary drinks, and other foods with added sugar. Full-fat dairy products. Do not salt foods before eating. Try to eat at least 2 vegetarian meals each week. Eat more home-cooked food and less restaurant, buffet, and fast food. When eating at a restaurant, ask that your food be prepared with less salt or no salt, if possible. What foods are recommended? The items listed may not be a complete list. Talk with your dietitian about what dietary choices are best for you. Grains Whole-grain or whole-wheat bread. Whole-grain or whole-wheat pasta. Brown rice. [place]. Bulgur. Whole-grain and low-sodium cereals. Pita bread. Low-fat, low-sodium crackers. Whole-wheat flour tortillas. Vegetables Fresh or frozen vegetables (raw, steamed, roasted, or grilled). Low-sodium or reduced-sodium tomato and vegetable juice. Low-sodium or reduced-sodium tomato sauce and tomato paste. Low-sodium or reduced-sodium canned vegetables. Fruits All fresh, dried, or frozen fruit. Canned fruit in natural  juice (without added sugar). Meat and other protein foods Skinless chicken or [place]. Ground chicken or [place]. Pork with fat trimmed off. Fish and seafood. Egg whites. Dried beans, peas, or lentils. Unsalted nuts, nut butters, and seeds. Unsalted canned beans. Lean cuts of beef with fat trimmed off. Low-sodium, lean deli meat. Dairy Low-fat (1%) or fat-free (skim) milk. Fat-free, low-fat, or reduced-fat cheeses. Nonfat, low-sodium ricotta or cottage cheese. Low-fat or nonfat yogurt. Low-fat, low-sodium cheese. Fats and oils Soft margarine without trans fats. Vegetable oil. Low-fat, reduced-fat, or light mayonnaise and salad dressings (reduced-sodium). Canola, safflower, olive, soybean, and sunflower oils. Avocado. Seasoning and other foods Herbs. Spices. Seasoning mixes without salt. Unsalted popcorn and pretzels. Fat-free sweets. What foods are not recommended? The items listed may not be a complete list. Talk with your dietitian about what dietary choices are best for you. Grains Baked goods made with fat, such as croissants, muffins, or some breads. Dry pasta or rice meal packs. Vegetables Creamed or fried vegetables. Vegetables in a cheese sauce. Regular canned vegetables (not low-sodium or  reduced-sodium). Regular canned tomato sauce and paste (not low-sodium or reduced-sodium). Regular tomato and vegetable juice (not low-sodium or reduced-sodium). Angie Fava. Olives. Fruits Canned fruit in a light or heavy syrup. Fried fruit. Fruit in cream or butter sauce. Meat and other protein foods Fatty cuts of meat. Ribs. Fried meat. Berniece Salines. Sausage. Bologna and other processed lunch meats. Salami. Fatback. Hotdogs. Bratwurst. Salted nuts and seeds. Canned beans with added salt. Canned or smoked fish. Whole eggs or egg yolks. Chicken or [place] with skin. Dairy Whole or 2% milk, cream, and half-and-half. Whole or full-fat cream cheese. Whole-fat or sweetened yogurt. Full-fat cheese. Nondairy creamers.  Whipped toppings. Processed cheese and cheese spreads. Fats and oils Butter. Stick margarine. Lard. Shortening. Ghee. Bacon fat. Tropical oils, such as coconut, palm kernel, or palm oil. Seasoning and other foods Salted popcorn and pretzels. Onion salt, garlic salt, seasoned salt, table salt, and sea salt. Worcestershire sauce. Tartar sauce. Barbecue sauce. Teriyaki sauce. Soy sauce, including reduced-sodium. Steak sauce. Canned and packaged gravies. Fish sauce. Oyster sauce. Cocktail sauce. Horseradish that you find on the shelf. Ketchup. Mustard. Meat flavorings and tenderizers. Bouillon cubes. Hot sauce and Tabasco sauce. Premade or packaged marinades. Premade or packaged taco seasonings. Relishes. Regular salad dressings. Where to find more information: National Heart, Lung, and [place]: https://wilson-eaton.com/ American Heart Association: www.heart.org Summary The DASH eating plan is a healthy eating plan that has been shown to reduce high blood pressure (hypertension). It may also reduce your risk for type 2 diabetes, heart disease, and stroke. With the DASH eating plan, you should limit salt (sodium) intake to 2,300 mg a day. If you have hypertension, you may need to reduce your sodium intake to 1,500 mg a day. When on the DASH eating plan, aim to eat more fresh fruits and vegetables, whole grains, lean proteins, low-fat dairy, and heart-healthy fats. Work with your health care provider or diet and nutrition specialist (dietitian) to adjust your eating plan to your individual calorie needs. This information is not intended to replace advice given to you by your health care provider. Make sure you discuss any questions you have with your health care provider. Document Released: 05/17/2011 Document Revised: 05/10/2017 Document Reviewed: 05/21/2016 Elsevier Patient Education  2020 [place].        I,Mathew Stumpf,acting as a Education administrator for Skeet Latch, MD.,have documented all relevant  documentation on the behalf of Skeet Latch, MD,as directed by  Skeet Latch, MD while in the presence of Skeet Latch, MD.  I, Benitez Oval Linsey, MD have reviewed all documentation for this visit.  The documentation of the exam, diagnosis, procedures, and orders on 03/06/2022 are all accurate and complete.   Signed, Skeet Latch, MD  03/06/2022 9:53 AM    [place]

## 2022-03-06 NOTE — Assessment & Plan Note (Addendum)
Blood pressure uncontrolled.  BP has been 130s-150s.  BP 110s while at work.  She hasn't tolerated multiple medications.  She did well on olmesartan.  We will stop losartan and start olmesartan 20mg  daily.  Check BMP in a week.  She is doing a good job of walking 15-20 minutes three times per day.  She is no longer jogging.  Limited salt, caffeine, no EtOH.

## 2022-03-06 NOTE — Assessment & Plan Note (Signed)
Improved.  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

## 2022-03-06 NOTE — Patient Instructions (Signed)
Medication Instructions:  STOP LOSARTAN   START OLMESARTAN 40 MG DAILY    Labwork: BMET IN 1 WEEK    Testing/Procedures: CALCIUM SCORE-THIS WILL COST YOU $99 OUT OF POCKET    Follow-Up: 1 MONTH WITH PHARM D    Special Instructions:  MONITOR YOUR BLOOD PRESSURE TWICE A DAY, LOG IN THE BOOK PROVIDED. BRING THE BOOK AND YOUR BLOOD PRESSURE MACHINE TO YOUR FOLLOW UP IN 1 MONTH   DASH Eating Plan DASH stands for "Dietary Approaches to Stop Hypertension." The DASH eating plan is a healthy eating plan that has been shown to reduce high blood pressure (hypertension). It may also reduce your risk for type 2 diabetes, heart disease, and stroke. The DASH eating plan may also help with weight loss. What are tips for following this plan?  General guidelines Avoid eating more than 2,300 mg (milligrams) of salt (sodium) a day. If you have hypertension, you may need to reduce your sodium intake to 1,500 mg a day. Limit alcohol intake to no more than 1 drink a day for nonpregnant women and 2 drinks a day for men. One drink equals 12 oz of beer, 5 oz of wine, or 1 oz of hard liquor. Work with your health care provider to maintain a healthy body weight or to lose weight. Ask what an ideal weight is for you. Get at least 30 minutes of exercise that causes your heart to beat faster (aerobic exercise) most days of the week. Activities may include walking, swimming, or biking. Work with your health care provider or diet and nutrition specialist (dietitian) to adjust your eating plan to your individual calorie needs. Reading food labels  Check food labels for the amount of sodium per serving. Choose foods with less than 5 percent of the Daily Value of sodium. Generally, foods with less than 300 mg of sodium per serving fit into this eating plan. To find whole grains, look for the word "whole" as the first word in the ingredient list. Shopping Buy products labeled as "low-sodium" or "no salt added." Buy  fresh foods. Avoid canned foods and premade or frozen meals. Cooking Avoid adding salt when cooking. Use salt-free seasonings or herbs instead of table salt or sea salt. Check with your health care provider or pharmacist before using salt substitutes. Do not fry foods. Cook foods using healthy methods such as baking, boiling, grilling, and broiling instead. Cook with heart-healthy oils, such as olive, canola, soybean, or sunflower oil. Meal planning Eat a balanced diet that includes: 5 or more servings of fruits and vegetables each day. At each meal, try to fill half of your plate with fruits and vegetables. Up to 6-8 servings of whole grains each day. Less than 6 oz of lean meat, poultry, or fish each day. A 3-oz serving of meat is about the same size as a deck of cards. One egg equals 1 oz. 2 servings of low-fat dairy each day. A serving of nuts, seeds, or beans 5 times each week. Heart-healthy fats. Healthy fats called Omega-3 fatty acids are found in foods such as flaxseeds and coldwater fish, like sardines, salmon, and mackerel. Limit how much you eat of the following: Canned or prepackaged foods. Food that is high in trans fat, such as fried foods. Food that is high in saturated fat, such as fatty meat. Sweets, desserts, sugary drinks, and other foods with added sugar. Full-fat dairy products. Do not salt foods before eating. Try to eat at least 2 vegetarian meals each week.  Eat more home-cooked food and less restaurant, buffet, and fast food. When eating at a restaurant, ask that your food be prepared with less salt or no salt, if possible. What foods are recommended? The items listed may not be a complete list. Talk with your dietitian about what dietary choices are best for you. Grains Whole-grain or whole-wheat bread. Whole-grain or whole-wheat pasta. Brown rice. [place]. Bulgur. Whole-grain and low-sodium cereals. Pita bread. Low-fat, low-sodium crackers. Whole-wheat  flour tortillas. Vegetables Fresh or frozen vegetables (raw, steamed, roasted, or grilled). Low-sodium or reduced-sodium tomato and vegetable juice. Low-sodium or reduced-sodium tomato sauce and tomato paste. Low-sodium or reduced-sodium canned vegetables. Fruits All fresh, dried, or frozen fruit. Canned fruit in natural juice (without added sugar). Meat and other protein foods Skinless chicken or [place]. Ground chicken or [place]. Pork with fat trimmed off. Fish and seafood. Egg whites. Dried beans, peas, or lentils. Unsalted nuts, nut butters, and seeds. Unsalted canned beans. Lean cuts of beef with fat trimmed off. Low-sodium, lean deli meat. Dairy Low-fat (1%) or fat-free (skim) milk. Fat-free, low-fat, or reduced-fat cheeses. Nonfat, low-sodium ricotta or cottage cheese. Low-fat or nonfat yogurt. Low-fat, low-sodium cheese. Fats and oils Soft margarine without trans fats. Vegetable oil. Low-fat, reduced-fat, or light mayonnaise and salad dressings (reduced-sodium). Canola, safflower, olive, soybean, and sunflower oils. Avocado. Seasoning and other foods Herbs. Spices. Seasoning mixes without salt. Unsalted popcorn and pretzels. Fat-free sweets. What foods are not recommended? The items listed may not be a complete list. Talk with your dietitian about what dietary choices are best for you. Grains Baked goods made with fat, such as croissants, muffins, or some breads. Dry pasta or rice meal packs. Vegetables Creamed or fried vegetables. Vegetables in a cheese sauce. Regular canned vegetables (not low-sodium or reduced-sodium). Regular canned tomato sauce and paste (not low-sodium or reduced-sodium). Regular tomato and vegetable juice (not low-sodium or reduced-sodium). Angie Fava. Olives. Fruits Canned fruit in a light or heavy syrup. Fried fruit. Fruit in cream or butter sauce. Meat and other protein foods Fatty cuts of meat. Ribs. Fried meat. Berniece Salines. Sausage. Bologna and other processed lunch  meats. Salami. Fatback. Hotdogs. Bratwurst. Salted nuts and seeds. Canned beans with added salt. Canned or smoked fish. Whole eggs or egg yolks. Chicken or [place] with skin. Dairy Whole or 2% milk, cream, and half-and-half. Whole or full-fat cream cheese. Whole-fat or sweetened yogurt. Full-fat cheese. Nondairy creamers. Whipped toppings. Processed cheese and cheese spreads. Fats and oils Butter. Stick margarine. Lard. Shortening. Ghee. Bacon fat. Tropical oils, such as coconut, palm kernel, or palm oil. Seasoning and other foods Salted popcorn and pretzels. Onion salt, garlic salt, seasoned salt, table salt, and sea salt. Worcestershire sauce. Tartar sauce. Barbecue sauce. Teriyaki sauce. Soy sauce, including reduced-sodium. Steak sauce. Canned and packaged gravies. Fish sauce. Oyster sauce. Cocktail sauce. Horseradish that you find on the shelf. Ketchup. Mustard. Meat flavorings and tenderizers. Bouillon cubes. Hot sauce and Tabasco sauce. Premade or packaged marinades. Premade or packaged taco seasonings. Relishes. Regular salad dressings. Where to find more information: National Heart, Lung, and [place]: https://wilson-eaton.com/ American Heart Association: www.heart.org Summary The DASH eating plan is a healthy eating plan that has been shown to reduce high blood pressure (hypertension). It may also reduce your risk for type 2 diabetes, heart disease, and stroke. With the DASH eating plan, you should limit salt (sodium) intake to 2,300 mg a day. If you have hypertension, you may need to reduce your sodium intake to 1,500 mg a day.  When on the DASH eating plan, aim to eat more fresh fruits and vegetables, whole grains, lean proteins, low-fat dairy, and heart-healthy fats. Work with your health care provider or diet and nutrition specialist (dietitian) to adjust your eating plan to your individual calorie needs. This information is not intended to replace advice given to you by your health care  provider. Make sure you discuss any questions you have with your health care provider. Document Released: 05/17/2011 Document Revised: 05/10/2017 Document Reviewed: 05/21/2016 Elsevier Patient Education  2020 [employer].

## 2022-03-09 ENCOUNTER — Ambulatory Visit
Admission: RE | Admit: 2022-03-09 | Discharge: 2022-03-09 | Disposition: A | Discharge status: Home / Self Care | Payer: BC Managed Care – PPO | Source: Ambulatory Visit | Attending: Obstetrics and Gynecology | Admitting: Obstetrics and Gynecology

## 2022-03-09 DIAGNOSIS — Z1231 Encounter for screening mammogram for malignant neoplasm of breast: Secondary | ICD-10-CM

## 2022-03-19 ENCOUNTER — Encounter (HOSPITAL_BASED_OUTPATIENT_CLINIC_OR_DEPARTMENT_OTHER): Payer: Self-pay | Admitting: Cardiovascular Disease

## 2022-03-19 NOTE — Telephone Encounter (Signed)
Please advise 

## 2022-03-20 ENCOUNTER — Telehealth (HOSPITAL_BASED_OUTPATIENT_CLINIC_OR_DEPARTMENT_OTHER): Payer: Self-pay | Admitting: *Deleted

## 2022-03-20 MED ORDER — LOSARTAN POTASSIUM 25 MG PO TABS: ORAL_TABLET | ORAL | 3 refills | Status: DC

## 2022-03-20 NOTE — Telephone Encounter (Signed)
     03/20/22  1:10 PM Thank you for your response. Losartan gave me similar side effects with unbearable headaches. That is why I went to olmesartan.    Please I can't go backwards again.   Please advise [name]     03/20/22  9:23 AM Good morning   Below from pharmacist that helps with Dr Blenda Mounts hypertension management   Rollen Sox, [facility]       03/20/22  8:33 AM Agree with stopping medication.  If she was not having this side effect with losartan she can restart it until she is seen by PharmD   We had you taking the Losartan 25 mg 1/2 tablet at bedtime, is this correct? Please give update on how you are doing    Rip Harbour   Last read by [name] at  1:08 PM on 03/20/2022. Rollen Sox, RPH to [name], RN  Skeet Latch, MD  Me      03/20/22  8:33 AM Agree with stopping medication.  If she was not having this side effect with losartan she can restart it until she is seen by PharmD  [name] to [name] (supporting Skeet Latch, MD)       03/19/22  4:04 PM Dr Oval Linsey   I have been taking olmesartan 40 and it was making my the surrounding of my lips tingle the entire day. My blood pressure was running good at 134/68 however the ringingly and numbness was bothersome. This morning I took 1/2 and wasn't as bad but a tingling in the face. Please help. Numb and tingling in face on medication. I was trying to make it at the end of month.   Please help with better med that doesn't impact face   Kristen Santiago  [place] message sent, will forward to Dr Oval Linsey for review

## 2022-03-21 NOTE — Telephone Encounter (Signed)
Patient not wanting to go back on Losartan or Olmesartan please advise

## 2022-03-22 MED ORDER — HYDRALAZINE HCL 25 MG PO TABS: 25.0000 mg | ORAL_TABLET | Freq: Two times a day (BID) | ORAL | 5 refills | Status: DC

## 2022-03-22 NOTE — Addendum Note (Signed)
Addended by: Alvina Filbert B on: 03/22/2022 05:16 PM   Modules accepted: Orders

## 2022-03-22 NOTE — Telephone Encounter (Signed)
Follow up on discontinuing amlodipine

## 2022-03-22 NOTE — Telephone Encounter (Signed)
March 22, 2022 Skeet Latch, MD to Me      03/22/22 12:15 PM Try hydralazine 25mg  bid instead.   TCR   [place] message sent to patient

## 2022-04-04 ENCOUNTER — Ambulatory Visit: Payer: PRIVATE HEALTH INSURANCE | Admitting: Family

## 2022-04-06 ENCOUNTER — Ambulatory Visit
Payer: PRIVATE HEALTH INSURANCE | Attending: Cardiology | Admitting: Pharmacist Clinician (PhC)/ Clinical Pharmacy Specialist

## 2022-04-06 ENCOUNTER — Encounter: Payer: Self-pay | Admitting: Pharmacist Clinician (PhC)/ Clinical Pharmacy Specialist

## 2022-04-06 DIAGNOSIS — I10 Essential (primary) hypertension: Secondary | ICD-10-CM

## 2022-04-06 MED ORDER — HYDRALAZINE HCL 25 MG PO TABS: 25.0000 mg | ORAL_TABLET | Freq: Three times a day (TID) | ORAL | 3 refills | Status: DC

## 2022-04-06 NOTE — Patient Instructions (Signed)
Return for a a follow up appointment Thursday Nov 30 at 4 pm  Check your blood pressure at home daily and keep record of the readings.  Take your BP meds as follows:  Increase hydralazine to 25 mg three times daily  Bring all of your meds, your BP cuff and your record of home blood pressures to your next appointment.  Exercise as you're able, try to walk approximately 30 minutes per day.  Keep salt intake to a minimum, especially watch canned and prepared boxed foods.  Eat more fresh fruits and vegetables and fewer canned items.  Avoid eating in fast food restaurants.    HOW TO TAKE YOUR BLOOD PRESSURE: Rest 5 minutes before taking your blood pressure.  Don't smoke or drink caffeinated beverages for at least 30 minutes before. Take your blood pressure before (not after) you eat. Sit comfortably with your back supported and both feet on the floor (don't cross your legs). Elevate your arm to heart level on a table or a desk. Use the proper sized cuff. It should fit smoothly and snugly around your bare upper arm. There should be enough room to slip a fingertip under the cuff. The bottom edge of the cuff should be 1 inch above the crease of the elbow. Ideally, take 3 measurements at one sitting and record the average.

## 2022-04-06 NOTE — Progress Notes (Signed)
04/06/2022 Kristen Santiago [birth year] [id number]   HPI:  Kristen Santiago is a 56 y.o. female patient of Dr Oval Linsey, who presents today for advanced hypertension clinic follow up. In addition to hypertension, her cardiac history is significant for hyperlipidemia and obesity.  Kristen Santiago was seen last month by Dr. Oval Linsey, at which time her blood pressure in office was 148/82.  She reports having multiple medication intolerances over the years.  Dr. Oval Linsey started her on olmesartan, as patient reported having previously done well with this.  Unfortunately after starting this she messaged the office that olmesartan was causing her lips to tingle.  Dr Oval Linsey switched her to hydralazine 25 mg twice daily (Oct 12) and she returns to the office today for follow up.     She has done well with the hydralazine.   States her lips tingled for a few days after stopping the ARB, but since then she has felt fine.  We reviewed her medication sensitivities and options still available.  Although she has a strong family history of hypertension, none of her relatives have the same challenges with medications that she has.  She does endorse some stress in her daily life.  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  She has been checking BP at home a few times in the past couple of weeks since starting hydralazine (see below).     Blood Pressure Goal:  130/80  Current Medications:  hydralazine 25 mg bid   Family Hx:   both parents, sister, brother, niece all have hypertension  Social Hx: no tobacco,  no alcohol, only decaf herbal teas (hibiscus, ginger)  Diet:  Mediterranean diet, lost weight, down to 162 lb; lots of vegetables, some breakfast bars for snacking; drinks herbal teas  Exercise: jogging 2 miles couple of times each week. Walks dogs daily (Shih-Tsus)  Home BP readings:  home cuff 57-87 years old, only has a few  days of readings since starting hydralazine  AM average (9 readings in 3 days) 250 systolic  PM average (3 readings in 1 night) 539 systolic  Diastolic readings all WNL  Intolerances: losartan - headache Lisinopril hctz - "did not agree with her", indigestion Spironolactone - palpitations Metoprolol Bisoprolol amlodipine - headaches Olmesartan- lips tingling  Labs: 7/23:  Na 138, K 4.3, Glu 90, BUN 17, SCr 0.9, GFR 70.96   Wt Readings from Last 3 Encounters:  03/06/22 161 lb 9.6 oz (73.3 kg)  01/02/22 160 lb 8 oz (72.8 kg)  11/13/21 161 lb 6 oz (73.2 kg)   BP Readings from Last 3 Encounters:  04/06/22 (!) 161/92  03/06/22 (!) 148/82  01/02/22 (!) 154/84   Pulse Readings from Last 3 Encounters:  04/06/22 76  03/06/22 83  01/02/22 95    Current Outpatient Medications  Medication Sig Dispense Refill   hydrALAZINE (APRESOLINE) 25 MG tablet Take 1 tablet (25 mg total) by mouth 3 (three) times daily. 90 tablet 3   magnesium oxide (MAG-OX) 400 MG tablet Take 200 mg by mouth every evening.     No current facility-administered medications for this visit.    Allergies  Allergen Reactions   Amlodipine     Other reaction(s): Other   Bisoprolol     Other reaction(s): Other   Escitalopram     Other reaction(s): Other, Other (See Comments) Other reaction(s): Other, Other (See Comments)    Floxacillin [Floxacillin (Flucloxacillin)] Swelling  Hydrochlorothiazide    Losartan     Headache    Olmesartan     Tingling around lips     Past Medical History:  Diagnosis Date   Anxiety    GERD (gastroesophageal reflux disease)    Weight increased 01/12/2021    Blood pressure (!) 161/92, pulse 57.  HYPERTENSION CONTROL Vitals:   04/06/22 1541 04/06/22 1542  BP: (!) 174/101 (!) 161/92    The patient's blood pressure is elevated above target today.  In order to address the patient's elevated BP: A current anti-hypertensive medication was adjusted today.    Chronic  hypertension Patient with essential hypertension, with probable white coat hypertension, currently not at goal on hydralazine 25 mg twice daily.  Fortunately she has tolerated the medication and seen some drop in home readings.  We had a long discussion about patients with multiple medication sensitivities, and whether to treat with increasing doses of hydralazine, or leave the dose alone since she is tolerating, and instead add low dose of another medication.   She has never taken a thiazide diuretic on it's own, so could consider trial of chlorthalidone 12.5 mg.  Today will increase hydralazine to three times daily.  She should check BP at home only twice daily after 5 minute rest (not three times at each sitting, as this is causing her some anxiety).  She will return in 1 month for follow up with her home readings as well as her home cuff.  Also advised she not check home pressure multiple times per day or when she's feeling poorly, as this will just bring on added stress.     Tommy Medal PharmD CPP [facility] [address] [place], [place] [address] [phone]

## 2022-04-06 NOTE — Assessment & Plan Note (Signed)
Patient with essential hypertension, with probable white coat hypertension, currently not at goal on hydralazine 25 mg twice daily.  Fortunately she has tolerated the medication and seen some drop in home readings.  We had a long discussion about patients with multiple medication sensitivities, and whether to treat with increasing doses of hydralazine, or leave the dose alone since she is tolerating, and instead add low dose of another medication.   She has never taken a thiazide diuretic on it's own, so could consider trial of chlorthalidone 12.5 mg.  Today will increase hydralazine to three times daily.  She should check BP at home only twice daily after 5 minute rest (not three times at each sitting, as this is causing her some anxiety).  She will return in 1 month for follow up with her home readings as well as her home cuff.  Also advised she not check home pressure multiple times per day or when she's feeling poorly, as this will just bring on added stress.

## 2022-04-16 ENCOUNTER — Ambulatory Visit (HOSPITAL_BASED_OUTPATIENT_CLINIC_OR_DEPARTMENT_OTHER): Payer: PRIVATE HEALTH INSURANCE | Admitting: Cardiovascular Disease

## 2022-04-17 LAB — HM PAP SMEAR: HPV 16/18/45 genotyping: NEGATIVE

## 2022-04-20 ENCOUNTER — Ambulatory Visit (HOSPITAL_BASED_OUTPATIENT_CLINIC_OR_DEPARTMENT_OTHER)
Admission: RE | Admit: 2022-04-20 | Discharge: 2022-04-20 | Disposition: A | Discharge status: Home / Self Care | Payer: BC Managed Care – PPO | Source: Ambulatory Visit | Attending: Cardiovascular Disease | Admitting: Cardiovascular Disease

## 2022-04-20 DIAGNOSIS — I10 Essential (primary) hypertension: Secondary | ICD-10-CM

## 2022-04-20 DIAGNOSIS — E782 Mixed hyperlipidemia: Secondary | ICD-10-CM | POA: Insufficient documentation

## 2022-04-21 LAB — BASIC METABOLIC PANEL
BUN/Creatinine Ratio: 21 (ref 9–23)
BUN: 18 mg/dL (ref 6–24)
CO2: 26 mmol/L (ref 20–29)
Calcium: 10 mg/dL (ref 8.7–10.2)
Chloride: 108 mmol/L — ABNORMAL HIGH (ref 96–106)
Creatinine, Ser: 0.85 mg/dL (ref 0.57–1.00)
Glucose: 74 mg/dL (ref 70–99)
Potassium: 5.6 mmol/L — ABNORMAL HIGH (ref 3.5–5.2)
Sodium: 145 mmol/L — ABNORMAL HIGH (ref 134–144)
eGFR: 80 mL/min/{1.73_m2} (ref >59)

## 2022-04-23 ENCOUNTER — Other Ambulatory Visit (HOSPITAL_BASED_OUTPATIENT_CLINIC_OR_DEPARTMENT_OTHER): Payer: Self-pay | Admitting: *Deleted

## 2022-04-23 DIAGNOSIS — E875 Hyperkalemia: Secondary | ICD-10-CM

## 2022-04-24 ENCOUNTER — Other Ambulatory Visit (HOSPITAL_BASED_OUTPATIENT_CLINIC_OR_DEPARTMENT_OTHER): Payer: Self-pay | Admitting: *Deleted

## 2022-04-24 DIAGNOSIS — E875 Hyperkalemia: Secondary | ICD-10-CM

## 2022-04-24 NOTE — Addendum Note (Signed)
Addended by: Regis Bill B on: 04/24/2022 01:20 PM   Modules accepted: Orders

## 2022-04-26 LAB — BASIC METABOLIC PANEL
BUN/Creatinine Ratio: 16 (ref 9–23)
BUN: 14 mg/dL (ref 6–24)
CO2: 22 mmol/L (ref 20–29)
Calcium: 10.4 mg/dL — ABNORMAL HIGH (ref 8.7–10.2)
Chloride: 103 mmol/L (ref 96–106)
Creatinine, Ser: 0.87 mg/dL (ref 0.57–1.00)
Glucose: 82 mg/dL (ref 70–99)
Potassium: 5 mmol/L (ref 3.5–5.2)
Sodium: 141 mmol/L (ref 134–144)
eGFR: 78 mL/min/{1.73_m2} (ref >59)

## 2022-05-10 ENCOUNTER — Ambulatory Visit
Payer: BC Managed Care – PPO | Attending: Cardiology | Admitting: Pharmacist Clinician (PhC)/ Clinical Pharmacy Specialist

## 2022-05-10 VITALS — BP 177/105 | HR 92

## 2022-05-10 DIAGNOSIS — I10 Essential (primary) hypertension: Secondary | ICD-10-CM | POA: Diagnosis not present

## 2022-05-10 MED ORDER — CHLORTHALIDONE 25 MG PO TABS: 12.5000 mg | ORAL_TABLET | Freq: Every day | ORAL | 3 refills | Status: DC

## 2022-05-10 NOTE — Patient Instructions (Signed)
Return for a a follow up appointment Monday January 15 at 11:30 am  Check your blood pressure at home daily and keep record of the readings.  Take your BP meds as follows:  Start chlorthalidone 12.5 mg (1/2 tablet) once daily  Continue with hydralazine three times daily  Bring all of your meds, your BP cuff and your record of home blood pressures to your next appointment.  Exercise as you're able, try to walk approximately 30 minutes per day.  Keep salt intake to a minimum, especially watch canned and prepared boxed foods.  Eat more fresh fruits and vegetables and fewer canned items.  Avoid eating in fast food restaurants.    HOW TO TAKE YOUR BLOOD PRESSURE: Rest 5 minutes before taking your blood pressure.  Don't smoke or drink caffeinated beverages for at least 30 minutes before. Take your blood pressure before (not after) you eat. Sit comfortably with your back supported and both feet on the floor (don't cross your legs). Elevate your arm to heart level on a table or a desk. Use the proper sized cuff. It should fit smoothly and snugly around your bare upper arm. There should be enough room to slip a fingertip under the cuff. The bottom edge of the cuff should be 1 inch above the crease of the elbow. Ideally, take 3 measurements at one sitting and record the average.

## 2022-05-10 NOTE — Progress Notes (Signed)
06/08/2022 Kristen Santiago 1966/01/24 [id number]   HPI:  Kristen Santiago is a 56 y.o. female patient of Dr Duke Salvia, who presents today for advanced hypertension clinic follow up. In addition to hypertension, her cardiac history is significant for hyperlipidemia and obesity.  [name] was seen last month by Dr. Duke Salvia, at which time her blood pressure in office was 148/82.  She reports having multiple medication intolerances over the years.  Dr. Duke Salvia started her on olmesartan, as patient reported having previously done well with this.  Unfortunately after starting this she messaged the office that olmesartan was causing her lips to tingle.  Dr Duke Salvia switched her to hydralazine 25 mg twice daily (Oct 12) and she returns to the office today for follow up.     She has done well with the hydralazine.   States her lips tingled for a few days after stopping the ARB, but since then she has felt fine.  We reviewed her medication sensitivities and options still available.  Although she has a strong family history of hypertension, none of her relatives have the same challenges with medications that she has.  She does endorse some stress in her daily life.  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  She has been checking BP at home a few times in the past couple of weeks since starting hydralazine (see below).    Last visit BP 161/92.  Increased hydralazine to 25 mg tid; was asked to bring home cuff and list of readings.    Blood Pressure Goal:  130/80  Current Medications:  hydralazine 25 mg bid   Family Hx:   both parents, sister, brother, niece all have hypertension  Social Hx: no tobacco,  no alcohol, only decaf herbal teas (hibiscus, ginger)  Diet:  Mediterranean diet, lost weight, down to 162 lb; lots of vegetables, some breakfast bars for snacking; drinks herbal teas  Exercise: jogging 2 miles couple  of times each week. Walks dogs daily (Shih-Tsus)  Home BP readings:  home cuff 23-25 years old - reads within 10 points;  125-165; diastolic wnl 98%      Intolerances: losartan - headache Lisinopril hctz - "did not agree with her", indigestion Spironolactone - palpitations Metoprolol Bisoprolol amlodipine - headaches Olmesartan- lips tingling  Labs: 7/23:  Na 138, K 4.3, Glu 90, BUN 17, SCr 0.9, GFR 70.96   Wt Readings from Last 3 Encounters:  03/06/22 161 lb 9.6 oz (73.3 kg)  01/02/22 160 lb 8 oz (72.8 kg)  11/13/21 161 lb 6 oz (73.2 kg)   BP Readings from Last 3 Encounters:  06/08/22 (!) 177/105  04/06/22 (!) 161/92  03/06/22 (!) 148/82   Pulse Readings from Last 3 Encounters:  05/10/22 92  04/06/22 76  03/06/22 83    Current Outpatient Medications  Medication Sig Dispense Refill   furosemide (LASIX) 20 MG tablet Take 1 tablet (20 mg total) by mouth daily. 30 tablet 2   hydrALAZINE (APRESOLINE) 25 MG tablet Take 1 tablet (25 mg total) by mouth 3 (three) times daily. 90 tablet 3   magnesium oxide (MAG-OX) 400 MG tablet Take 200 mg by mouth every evening.     vitamin B-12 (CYANOCOBALAMIN) 500 MCG tablet Take 500 mcg by mouth daily.     No current facility-administered medications for this visit.    Allergies  Allergen Reactions   Amlodipine     Other reaction(s): Other  Bisoprolol     Other reaction(s): Other   Escitalopram     Other reaction(s): Other, Other (See Comments) Other reaction(s): Other, Other (See Comments)    Floxacillin [Floxacillin (Flucloxacillin)] Swelling   Hydrochlorothiazide    Losartan     Headache    Olmesartan     Tingling around lips     Past Medical History:  Diagnosis Date   Anxiety    GERD (gastroesophageal reflux disease)    Weight increased 01/12/2021    Blood pressure (!) 177/105, pulse 92.   Assessment: BP is uncontrolled in office BP 177/105 mmHg;  above the goal (<130/80). Unable to take multiple BP medications  secondary to side effects Tolerates hydralazine well without any side effects Denies SOB, palpitation, chest pain, headaches,or swelling Reiterated the importance of regular exercise and low salt diet   Plan:  Start taking chlorthalidone 12.5 mg once daily Continue taking hydralazine 25 mg three times daily Patient to keep record of BP readings with heart rate and report to [place] at the next visit Patient to follow up with PharmD in 6 weeks for follow up  Labs ordered today: none - will draw at next visit   Phillips Hay PharmD CPP [facility] [address] [place], [place] [address] [phone]

## 2022-05-29 LAB — BASIC METABOLIC PANEL
BUN/Creatinine Ratio: 17 (ref 9–23)
BUN: 16 mg/dL (ref 6–24)
CO2: 23 mmol/L (ref 20–29)
Calcium: 10.7 mg/dL — ABNORMAL HIGH (ref 8.7–10.2)
Chloride: 104 mmol/L (ref 96–106)
Creatinine, Ser: 0.93 mg/dL (ref 0.57–1.00)
Glucose: 89 mg/dL (ref 70–99)
Potassium: 4.8 mmol/L (ref 3.5–5.2)
Sodium: 143 mmol/L (ref 134–144)
eGFR: 72 mL/min/{1.73_m2} (ref >59)

## 2022-06-08 ENCOUNTER — Encounter (HOSPITAL_BASED_OUTPATIENT_CLINIC_OR_DEPARTMENT_OTHER): Payer: Self-pay | Admitting: Cardiovascular Disease

## 2022-06-08 DIAGNOSIS — I10 Essential (primary) hypertension: Secondary | ICD-10-CM

## 2022-06-08 MED ORDER — FUROSEMIDE 20 MG PO TABS: 20.0000 mg | ORAL_TABLET | Freq: Every day | ORAL | 2 refills | Status: DC

## 2022-06-08 NOTE — Telephone Encounter (Signed)
Please advise 

## 2022-06-08 NOTE — Assessment & Plan Note (Signed)
Assessment: BP is uncontrolled in office BP 177/105 mmHg;  above the goal (<130/80). Unable to take multiple BP medications secondary to side effects Tolerates hydralazine well without any side effects Denies SOB, palpitation, chest pain, headaches,or swelling Reiterated the importance of regular exercise and low salt diet   Plan:  Start taking chlorthalidone 12.5 mg once daily Continue taking hydralazine 25 mg three times daily Patient to keep record of BP readings with heart rate and report to [place] at the next visit Patient to follow up with PharmD in 6 weeks for follow up  Labs ordered today: none - will draw at next visit

## 2022-06-25 ENCOUNTER — Encounter: Payer: Self-pay | Admitting: Pharmacist Clinician (PhC)/ Clinical Pharmacy Specialist

## 2022-06-25 ENCOUNTER — Ambulatory Visit
Payer: BC Managed Care – PPO | Attending: Cardiology | Admitting: Pharmacist Clinician (PhC)/ Clinical Pharmacy Specialist

## 2022-06-25 VITALS — BP 169/88 | HR 64

## 2022-06-25 DIAGNOSIS — I10 Essential (primary) hypertension: Secondary | ICD-10-CM | POA: Diagnosis not present

## 2022-06-25 NOTE — Progress Notes (Signed)
06/25/2022 Kristen Santiago May 02, 1966 [id number]   HPI:  Kristen Santiago is a 57 y.o. female patient of Dr Oval Linsey, who presents today for advanced hypertension clinic follow up. In addition to hypertension, her cardiac history is significant for hyperlipidemia and obesity.  Kristen Santiago was seen last month by Dr. Oval Linsey, at which time her blood pressure in office was 148/82.  She reports having multiple medication intolerances over the years.  Dr. Oval Linsey started her on olmesartan, as patient reported having previously done well with this.  Unfortunately after starting this she messaged the office that olmesartan was causing her lips to tingle.  Dr Oval Linsey switched her to hydralazine 25 mg twice daily (Oct 12) and she returns to the office today for follow up.     At her last PharmD appointment in November, she was started on chlorthalidone 12.5 mg daily. She messaged the office that the chlorthalidone caused a headache and a weird feeling in her face so it was discontinued. She was then started on furosemide 20 mg daily on (12/29). She was a little light headed when she first started the furosemide and experienced increased urination but it is not bothersome. She continues to do well with the hydralazine. She does note that she is a little anxious with two people being in the office which is contributing to her high blood pressure in office. She has taken an SSRI in the past but she took herself off of it and now she is taking magnesium which helps her anxiety. 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 She has been checking BP at home a few times in the past couple of weeks.    Last visit BP 177/105. Increased hydralazine to 25 mg TID and started chlorthalidone 12.5 mg daily; was asked to bring home cuff and list of readings.      Blood Pressure Goal:  130/80  Current Medications:  hydralazine 25 mg  TID, furosemide 20 mg daily    Family Hx: both parents, sister, brother, niece all have hypertension   Social Hx: no tobacco,  no alcohol, only decaf herbal teas (hibiscus, ginger)   Diet:  Mediterranean diet (cheated a little during the holidays), she was previously down  to 162 Ibs but after the holidays she has weighed 165 lb; she eats fresh vegetables (steamed); some breakfast bars, chips, and nuts for snacking   Exercise: jogging 2 miles couple of times each week. Walks dogs daily ([employer])   Home BP readings: home cuff 5-16 years old - reads within 10 points; AVG: 133/76 mmHg              Intolerances: losartan - headache Lisinopril hctz - "did not agree with her", indigestion Spironolactone - palpitations Metoprolol Bisoprolol amlodipine - headaches Olmesartan- lips tingling Chlorthalidone - headaches, weird feeling in face  Labs:   05/28/22 - Na 143, K 4.8, Glu 89, BUN 16, SCr 0.93, GFR 72  7/23:  Na 138, K 4.3, Glu 90, BUN 17, SCr 0.9, GFR 70.96   Wt Readings from Last 3 Encounters:  03/06/22 161 lb 9.6 oz (73.3 kg)  01/02/22 160 lb 8 oz (72.8 kg)  11/13/21 161 lb 6 oz (73.2 kg)   BP Readings from Last 3 Encounters:  06/25/22 (!) 169/88  06/08/22 (!) 177/105  04/06/22 (!) 161/92   Pulse Readings from Last 3 Encounters:  06/25/22 64  05/10/22 92  04/06/22 76  Current Outpatient Medications  Medication Sig Dispense Refill   furosemide (LASIX) 20 MG tablet Take 1 tablet (20 mg total) by mouth daily. 30 tablet 2   hydrALAZINE (APRESOLINE) 25 MG tablet Take 1 tablet (25 mg total) by mouth 3 (three) times daily. 90 tablet 3   magnesium oxide (MAG-OX) 400 MG tablet Take 200 mg by mouth every evening.     vitamin B-12 (CYANOCOBALAMIN) 500 MCG tablet Take 500 mcg by mouth daily.     No current facility-administered medications for this visit.    Allergies  Allergen Reactions   Amlodipine     Other reaction(s): Other   Bisoprolol     Other reaction(s): Other    Escitalopram     Other reaction(s): Other, Other (See Comments) Other reaction(s): Other, Other (See Comments)    Floxacillin [Floxacillin (Flucloxacillin)] Swelling   Hydrochlorothiazide    Losartan     Headache    Olmesartan     Tingling around lips     Past Medical History:  Diagnosis Date   Anxiety    GERD (gastroesophageal reflux disease)    Weight increased 01/12/2021    Blood pressure (!) 169/88, pulse 64.  Repeat pressure 159/99   Assessment: BP is uncontrolled in office BP 177/105 mmHg;  above the goal (<130/80). Unable to take multiple BP medications secondary to side effects Tolerates hydralazine well without any side effects Denies SOB, palpitation, chest pain, headaches,or swelling Reiterated the importance of regular exercise and low salt diet   Plan:  Start taking chlorthalidone 12.5 mg once daily Continue taking hydralazine 25 mg three times daily Patient to keep record of BP readings with heart rate and report to [place] at the next visit Patient to follow up with PharmD in 6 weeks for follow up  Labs ordered today: none - will draw at next visit No changes today as home BP readings are at goal, pt has some measure of white coat hypertension/anxiety   Tommy Medal PharmD [facility]PP [facility] [id number] [place], [place] [address] [phone]

## 2022-06-25 NOTE — Patient Instructions (Signed)
Follow up appointment: with Dr. Oval Linsey on April 3 at 8:30 am  Take your BP meds as follows:  Continue with hydralazine 25 mg three times daily  Continue with furosemide 20 mg daily  Check your blood pressure at home several days each week and keep record of the readings.  Hypertension "High blood pressure"  Hypertension is often called "The Silent Killer." It rarely causes symptoms until it is extremely  high or has done damage to other organs in the body. For this reason, you should have your  blood pressure checked regularly by your physician. We will check your blood pressure  every time you see a provider at one of our offices.   Your blood pressure reading consists of two numbers. Ideally, blood pressure should be  below 120/80. The first ("top") number is called the systolic pressure. It measures the  pressure in your arteries as your heart beats. The second ("bottom") number is called the diastolic pressure. It measures the pressure in your arteries as the heart relaxes between beats.  The benefits of getting your blood pressure under control are enormous. A 10-point  reduction in systolic blood pressure can reduce your risk of stroke by 27% and heart failure by 28%  Your blood pressure goal is < 130/80  To check your pressure at home you will need to:  1. Sit up in a chair, with feet flat on the floor and back supported. Do not cross your ankles or legs. 2. Rest your left arm so that the cuff is about heart level. If the cuff goes on your upper arm,  then just relax the arm on the table, arm of the chair or your lap. If you have a wrist cuff, we  suggest relaxing your wrist against your chest (think of it as Pledging the Flag with the  wrong arm).  3. Place the cuff snugly around your arm, about 1 inch above the crook of your elbow. The  cords should be inside the groove of your elbow.  4. Sit quietly, with the cuff in place, for about 5 minutes. After that 5 minutes  press the power  button to start a reading. 5. Do not talk or move while the reading is taking place.  6. Record your readings on a sheet of paper. Although most cuffs have a memory, it is often  easier to see a pattern developing when the numbers are all in front of you.  7. You can repeat the reading after 1-3 minutes if it is recommended  Make sure your bladder is empty and you have not had caffeine or tobacco within the last 30 min  Always bring your blood pressure log with you to your appointments. If you have not brought your monitor in to be double checked for accuracy, please bring it to your next appointment.  You can find a list of quality blood pressure cuffs at validatebp.org

## 2022-06-25 NOTE — Assessment & Plan Note (Signed)
Assessment: BP is uncontrolled in office BP 159/99 mmHg;  above the goal (<130/80). Unable to take multiple BP medications secondary to side effects Tolerates hydralazine well without any side effects Denies SOB, palpitation, chest pain, headaches,or swelling Reiterated the importance of regular exercise and low salt diet    Plan:  Continue taking hydralazine 25 mg three times daily and furosemide 20 mg daily Patient to keep record of BP readings with heart rate and report to [place] at the next visit Patient to follow up with Dr. Oval Linsey in 3 months for follow up  Labs ordered today: none

## 2022-07-03 LAB — BASIC METABOLIC PANEL
BUN/Creatinine Ratio: 16 (ref 9–23)
BUN: 13 mg/dL (ref 6–24)
CO2: 23 mmol/L (ref 20–29)
Calcium: 10.1 mg/dL (ref 8.7–10.2)
Chloride: 102 mmol/L (ref 96–106)
Creatinine, Ser: 0.83 mg/dL (ref 0.57–1.00)
Glucose: 73 mg/dL (ref 70–99)
Potassium: 4.5 mmol/L (ref 3.5–5.2)
Sodium: 140 mmol/L (ref 134–144)
eGFR: 82 mL/min/{1.73_m2} (ref >59)

## 2022-07-26 ENCOUNTER — Other Ambulatory Visit: Payer: Self-pay | Admitting: Internal Medicine

## 2022-07-26 DIAGNOSIS — Z13820 Encounter for screening for osteoporosis: Secondary | ICD-10-CM

## 2022-08-13 ENCOUNTER — Other Ambulatory Visit (HOSPITAL_BASED_OUTPATIENT_CLINIC_OR_DEPARTMENT_OTHER): Payer: Self-pay | Admitting: Cardiovascular Disease

## 2022-08-13 NOTE — Telephone Encounter (Signed)
Rx request sent to pharmacy.  

## 2022-09-04 ENCOUNTER — Other Ambulatory Visit (HOSPITAL_BASED_OUTPATIENT_CLINIC_OR_DEPARTMENT_OTHER): Payer: Self-pay | Admitting: Family

## 2022-09-04 DIAGNOSIS — I10 Essential (primary) hypertension: Secondary | ICD-10-CM

## 2022-09-04 NOTE — Telephone Encounter (Signed)
Rx(s) sent to pharmacy electronically.  

## 2022-09-12 ENCOUNTER — Ambulatory Visit (HOSPITAL_BASED_OUTPATIENT_CLINIC_OR_DEPARTMENT_OTHER): Payer: BC Managed Care – PPO | Admitting: Cardiovascular Disease

## 2022-09-12 ENCOUNTER — Encounter (HOSPITAL_BASED_OUTPATIENT_CLINIC_OR_DEPARTMENT_OTHER): Payer: Self-pay | Admitting: Cardiovascular Disease

## 2022-09-12 VITALS — BP 164/94 | HR 90 | Ht 62.5 in | Wt 167.4 lb

## 2022-09-12 DIAGNOSIS — I493 Ventricular premature depolarization: Secondary | ICD-10-CM | POA: Diagnosis not present

## 2022-09-12 DIAGNOSIS — R0789 Other chest pain: Secondary | ICD-10-CM | POA: Diagnosis not present

## 2022-09-12 DIAGNOSIS — I10 Essential (primary) hypertension: Secondary | ICD-10-CM

## 2022-09-12 DIAGNOSIS — E6609 Other obesity due to excess calories: Secondary | ICD-10-CM

## 2022-09-12 DIAGNOSIS — Z6830 Body mass index (BMI) 30.0-30.9, adult: Secondary | ICD-10-CM

## 2022-09-12 DIAGNOSIS — E782 Mixed hyperlipidemia: Secondary | ICD-10-CM | POA: Diagnosis not present

## 2022-09-12 MED ORDER — ESCITALOPRAM OXALATE 10 MG PO TABS: 10.0000 mg | ORAL_TABLET | Freq: Every day | ORAL | 1 refills | Status: DC

## 2022-09-12 NOTE — Assessment & Plan Note (Signed)
She is really struggling with anxiety.  She is on edge from family stressors.  She has been on citalopram in the past.  We will try Lexapro 10 mg daily.  Recommend that she get back into therapy and follow up with her PCP.  Continue magnesium. Start back exercising.

## 2022-09-12 NOTE — Assessment & Plan Note (Signed)
Recommended that she stop back exercising regularly.

## 2022-09-12 NOTE — Assessment & Plan Note (Signed)
Lipis have been elevated.  Calcium score 0 02/2022.  Continue with diet and exercise.  Check lipids today.  Repeat calcium score in 5 years.

## 2022-09-12 NOTE — Assessment & Plan Note (Signed)
Blood pressure is better at home than here.  Likely whitecoat hypertension superimposed on essential hypertension.  She is very anxious today and visibly tremulous.  Continue hydralazine and furosemide.  Recommend treating her anxiety first before adding the other antihypertensives.  We also briefly discussed renal denervation as an option.

## 2022-09-12 NOTE — Patient Instructions (Signed)
Medication Instructions:  START LEXAPRO 10 MG DAILY   Labwork: FASTING LP/CMET SOON   Testing/Procedures: NONE  Follow-Up: 11/22/2022 8:50 AM WITH CAITLIN W NP   02/19/2023 4:15 PM WITH DR Ventana   If you need a refill on your cardiac medications before your next appointment, please call your pharmacy.

## 2022-09-12 NOTE — Progress Notes (Signed)
Cardiology Office Note:    Date:  09/12/2022   ID:  Kristen Santiago, DOB May 25, 1966, MRN [id number]  PCP:  Mckinley Jewel, MD   [facility] Providers Cardiologist:  Skeet Latch, MD     Referring MD: Minus Breeding, MD   No chief complaint on file.   History of Present Illness:    Kristen Santiago is a 57 y.o. female with a hx of hyperlipidemia, PVC's, fatty liver disease,  here for uncontrolled hypertension. She saw her PCP on 12/2021 and complained of dizziness. She also had headaches after chlorthalidone was added to her regimen. She was instructed to add losartan if her BP was over 125/90 and referred to cardiology. Her blood pressure in the office was 154/84.  Previously she was taking lisinopril-HCTZ, but she began having indigestion and was "not agreeing with her." She was started on 25 mg olmesartan which was helping with no headaches or dizziness. At one time she was started on spironolactone, but she is not certain why. She had experienced palpitations so she was prescribed metoprolol.  She is very physically active and jogs regularly.  She also has a healthy diet.  At her visit 02/2022 losartan was switched to olmesartan given that she previously tolerated it.  However she called the office 03/2022 reporting that it was causing lip tingling.  This was switched to hydralazine..  She had a coronary calcium score 04/2022 that revealed no coronary calcification.  She followed up with our pharmacist 03/2022 and blood pressures remained poorly controlled.  Hydralazine was increased to 3 times daily.  At follow-up 04/2022 blood pressure remained uncontrolled and she was started on chlorthalidone.  Ms. Wire reports experiencing white coat hypertension, with significantly higher blood pressure readings during office visits compared to at home. At home, her blood pressure is more stable, often in the range of 130s/80s, which she finds satisfactory. She has stopped taking daily  blood pressure measurements due to the discrepancy between home and office readings.  She stopped taking chlorthalidone due to hypercalcemia.  Following this, she was switched to Lasix.   She expresses concern about the limited medication options available due to numerous medication allergies and is open to exploring alternative treatments, including renal denervation, which was suggested as a potential method to control her blood pressure without medication.  She also mentions experiencing PVCs (extra heartbeats) and acknowledges that anxiety might be contributing to her symptoms. She recalls previously being on citalopram for anxiety but discontinued it two years ago during the pandemic due to discomfort with its effects. She is unsure if she has tried Lexapro (escitalopram) but is open to considering it for managing her anxiety. Additionally, she has tried hydroxyzine for anxiety but views it as a less preventative option.  [name] discusses her lifestyle and health management strategies, including her diet and exercise routines. She notes a previous hip issue that prevented her from jogging, which has since improved with chiropractic care. She expresses a desire to return to exercise as a means of managing her blood pressure, cholesterol, and stress. She agrees to try Lexapro for anxiety and to continue monitoring her blood pressure at home. She also mentions logistical issues with her medication supplies and expresses a preference for three-month supplies to simplify her medication management.  Prior antihypertensives: Lisinopril/HCTZ Amlodipine Bisoprolol HCTZ Olmesartan- lip tingling Metoprolol Spironolactone-no clear adverse effects Chlorthalidone   Past Medical History:  Diagnosis Date   Anxiety    GERD (gastroesophageal reflux disease)    Weight increased  01/12/2021    Past Surgical History:  Procedure Laterality Date   ABDOMINAL HYSTERECTOMY     ANKLE SURGERY      TONSILLECTOMY      Current Medications: Current Meds  Medication Sig   escitalopram (LEXAPRO) 10 MG tablet Take 1 tablet (10 mg total) by mouth daily.   furosemide (LASIX) 20 MG tablet TAKE 1 TABLET BY MOUTH DAILY.   hydrALAZINE (APRESOLINE) 25 MG tablet TAKE (1) TABLET BY MOUTH (3) TIMES DAILY.   magnesium oxide (MAG-OX) 400 MG tablet Take 200 mg by mouth every evening.     Allergies:   Amlodipine, Bisoprolol, Escitalopram, Floxacillin [floxacillin (flucloxacillin)], Hydrochlorothiazide, Losartan, and Olmesartan   Social History   Socioeconomic History   Marital status: Single    Spouse name: Not on file   Number of children: Not on file   Years of education: Not on file   Highest education level: Not on file  Occupational History   Not on file  Tobacco Use   Smoking status: Never   Smokeless tobacco: Never  Vaping Use   Vaping Use: Never used  Substance and Sexual Activity   Alcohol use: Not Currently   Drug use: Never   Sexual activity: Never    Birth control/protection: None  Other Topics Concern   Not on file  Social History Narrative   Not on file   Social Determinants of Health   Financial Resource Strain: Low Risk  (03/06/2022)   Overall Financial Resource Strain (CARDIA)    Difficulty of Paying Living Expenses: Not hard at all  Food Insecurity: No Food Insecurity (03/06/2022)   Hunger Vital Sign    Worried About Running Out of Food in the Last Year: Never true    Ran Out of Food in the Last Year: Never true  Transportation Needs: No Transportation Needs (03/06/2022)   PRAPARE - Hydrologist (Medical): No    Lack of Transportation (Non-Medical): No  Physical Activity: Insufficiently Active (03/06/2022)   Exercise Vital Sign    Days of Exercise per Week: 2 days    Minutes of Exercise per Session: 30 min  Stress: Not on file  Social Connections: Not on file     Family History: The patient's family history includes Hypertension  in her brother, father, mother, niece, and sister.  ROS:   Please see the history of present illness.    (+) Headaches (+) Arthralgias (+) Numbness of bilateral toes All other systems reviewed and are negative.  EKGs/Labs/Other Studies Reviewed:    The following studies were reviewed today:  Monitor 11/2021  ([facility]): Patient was wearing extended 3 day Holter monitor starting on Oct 25, 2021.   Indication: PVCs.   Patient remained in sinus rhythm with minimum HR 61 at 5:33 AM on May 18, max HR 142 at 12:57 PM on May 19.  Average HR 87.   PVCs noted representing  5.3% of total beats. 192 couplets of PVCs noted.  250 PVCs noted in bigeminal pattern. Almost 7,400 PVCs noted in trigeminal pattern representing about 2% of total beats.   Only 23 supraventricular ectopic beats noted representing  <1% of total beats.   2 runs of narrow complex tachycardia noted. Patient remained asymptomatic during these episodes.  The fastest run was 4 beat run with HR 133 at 3:25 AM on May 18.  The longest run 16 beat run with HR 119 at 3:25 AM on May 18.   Np patient triggered episodes were recorded.  No data is available how long the patient was tachycardic or bradycardic.   The longest R to R interval 1 second at 5:33 AM on May 18.   No A. fib, no VT, no long pauses 3 seconds or more noted.  Echo  10/23/2021  ([facility]): Left Ventricle: Systolic function is normal. EF: 55-59%.    Left Ventricle: Wall motion is normal.    Left Ventricle: Doppler parameters indicate normal diastolic function.    Aortic Valve: Trace aortic valve regurgitation.    Tricuspid Valve: The right ventricular systolic pressure is normal (<36  mmHg).  Exercise Stress Test  12/27/2020  ([facility]): Left Ventricle: Systolic function is normal. EF: 60%.    Left Ventricle: Wall motion is normal.    Left Ventricle: Doppler parameters are indeterminate for diastolic  function.    Tricuspid Valve: Unable to assess RVSP  accurately without IVC  visualization but considering TR jet velocity only 1.3 m/s, RVSP is likely  within normal limits.    Post-stress: The left ventricle systolic function is hyperdynamic  post-stress with an EF of 70 - 72 %.    Post-stress: The post-stress echo showed normal wall motion which was  hyperdynamic compared to baseline.    Stress ECG: ECG Conclusion:  No ischemic ST segment changes occurred  with stress in the first 4 minutes in recovery.  At 5-minute in recovery  slight nonspecific 0.5 mm ST segment changes noted as above. No chest  pain. Normal functional capacity for age and gender with estimated  workload 7.2 METS.    Stress ECG: No ischemic ST segment changes occurred during exercise and  the first 4 minutes in recovery.  At 5-minute in recovery 0.5 mm flat ST  depression noted in leads III, aVF, V4, V5 and V6.    Stress ECG: No arrhythmias noted in the first 2 minutes in recovery.    At 3 minutes in recovery frequent PVCs noted some PVCs in trigeminal  pattern on and off up to 15 minutes in recovery.  At 16 minutes in  recovery PVCs got resolved.  Patient denied any palpitations, dizziness,  near-syncope or syncope.  Patient has not taken bisoprolol this morning  because of upcoming stress test.  She will take bisoprolol when she gets  home after stress test in 20 minutes as per patient.    Post-stress Impression: The study is normal.    Post-stress Impression: This study shows a low prognostic risk.   No significant valvular disease noted.  EKG:   EKG is personally reviewed. 03/06/2022: Sinus rhythm. Rate 83 bpm. 09/12/22: Sinus rhythm.  Rate 90 bpm.  PVCs.   Recent Labs: 01/02/2022: ALT 20 07/03/2022: BUN 13; Creatinine, Ser 0.83; Potassium 4.5; Sodium 140   Recent Lipid Panel    Component Value Date/Time   CHOL 255 (H) 01/02/2022 0958   TRIG 111.0 01/02/2022 0958   HDL 88.70 01/02/2022 0958   CHOLHDL 3 01/02/2022 0958   VLDL 22.2 01/02/2022 0958    LDLCALC 144 (H) 01/02/2022 0958     Risk Assessment/Calculations:          HYPERTENSION CONTROL Vitals:   09/12/22 0828 09/12/22 0839  BP: (!) 177/82 (!) 164/94    The patient's blood pressure is elevated above target today.  In order to address the patient's elevated BP:      Physical Exam:    Wt Readings from Last 3 Encounters:  09/12/22 167 lb 6.4 oz (75.9 kg)  03/06/22 161 lb 9.6 oz (73.3  kg)  01/02/22 160 lb 8 oz (72.8 kg)     VS:  BP (!) 164/94 (BP Location: Left Arm, Patient Position: Sitting, Cuff Size: Large)   Pulse 90   Ht 5' 2.5" (1.588 m)   Wt 167 lb 6.4 oz (75.9 kg)   BMI 30.13 kg/m  , BMI Body mass index is 30.13 kg/m. GENERAL:  Well appearing HEENT: Pupils equal round and reactive, fundi not visualized, oral mucosa unremarkable NECK:  No jugular venous distention, waveform within normal limits, carotid upstroke brisk and symmetric, no bruits, no thyromegaly LUNGS:  Clear to auscultation bilaterally HEART:  RRR with one PVC.  PMI not displaced or sustained,S1 and S2 within normal limits, no S3, no S4, no clicks, no rubs, no murmurs ABD:  Flat, positive bowel sounds normal in frequency in pitch, no bruits, no rebound, no guarding, no midline pulsatile mass, no hepatomegaly, no splenomegaly EXT:  2 plus pulses throughout, no edema, no cyanosis no clubbing SKIN:  No rashes no nodules NEURO:  Cranial nerves II through XII grossly intact, motor grossly intact throughout PSYCH:  Cognitively intact, oriented to person place and time   ASSESSMENT:    1. Chronic hypertension   2. Frequent PVCs   3. Mixed dyslipidemia   4. Atypical chest pain   5. Class 1 obesity due to excess calories with body mass index (BMI) of 30.0 to 30.9 in adult, unspecified whether serious comorbidity present     PLAN:    Atypical chest pain She is really struggling with anxiety.  She is on edge from family stressors.  She has been on citalopram in the past.  We will try Lexapro  10 mg daily.  Recommend that she get back into therapy and follow up with her PCP.  Continue magnesium. Start back exercising.   Mixed dyslipidemia Lipis have been elevated.  Calcium score 0 02/2022.  Continue with diet and exercise.  Check lipids today.  Repeat calcium score in 5 years.  Frequent PVCs Working on her anxiety as above.   Chronic hypertension Blood pressure is better at home than here.  Likely whitecoat hypertension superimposed on essential hypertension.  She is very anxious today and visibly tremulous.  Continue hydralazine and furosemide.  Recommend treating her anxiety first before adding the other antihypertensives.  We also briefly discussed renal denervation as an option.  Obesity Recommended that she stop back exercising regularly.   Disposition: FU with APP/PharmD in 1 month for the next 3 months. FU with Kalle Bernath C. Oval Linsey, MD, [facility] in 6 months.  Medication Adjustments/Labs and Tests Ordered: Current medicines are reviewed at length with the patient today.  Concerns regarding medicines are outlined above.   Orders Placed This Encounter  Procedures   Lipid panel   Comprehensive metabolic panel   EKG [id number]   Meds ordered this encounter  Medications   escitalopram (LEXAPRO) 10 MG tablet    Sig: Take 1 tablet (10 mg total) by mouth daily.    Dispense:  90 tablet    Refill:  1   Patient Instructions  Medication Instructions:  START LEXAPRO 10 MG DAILY   Labwork: FASTING LP/CMET SOON   Testing/Procedures: NONE  Follow-Up: 11/22/2022 8:50 AM WITH CAITLIN W NP   02/19/2023 4:15 PM WITH DR [place]   If you need a refill on your cardiac medications before your next appointment, please call your pharmacy.   Time spent: 45 minutes-Greater than 50% of this time was spent in counseling, explanation of diagnosis, planning  of further management, and coordination of care.   Signed, Skeet Latch, MD  09/12/2022 12:44 PM    [facility]

## 2022-09-12 NOTE — Assessment & Plan Note (Signed)
Working on her anxiety as above.

## 2022-09-13 ENCOUNTER — Encounter (HOSPITAL_BASED_OUTPATIENT_CLINIC_OR_DEPARTMENT_OTHER): Payer: Self-pay

## 2022-09-13 DIAGNOSIS — I10 Essential (primary) hypertension: Secondary | ICD-10-CM

## 2022-09-13 MED ORDER — FUROSEMIDE 20 MG PO TABS
20.0000 mg | ORAL_TABLET | Freq: Every day | ORAL | 3 refills | Status: DC
Start: 2022-09-13 — End: 2022-11-22

## 2022-09-13 MED ORDER — HYDRALAZINE HCL 25 MG PO TABS: ORAL_TABLET | ORAL | 3 refills | Status: DC

## 2022-09-13 MED ORDER — FUROSEMIDE 20 MG PO TABS: 20.0000 mg | ORAL_TABLET | Freq: Every day | ORAL | 3 refills | Status: DC

## 2022-09-13 MED ORDER — ESCITALOPRAM OXALATE 10 MG PO TABS: 10.0000 mg | ORAL_TABLET | Freq: Every day | ORAL | 3 refills | Status: DC

## 2022-09-13 NOTE — Addendum Note (Signed)
Addended by: Alvina Filbert B on: 09/13/2022 06:20 PM   Modules accepted: Orders

## 2022-09-13 NOTE — Telephone Encounter (Signed)
Spoke with patient and she was wanting medications sent to [facility] sent and spoke with pharmacy, they will get filled today  Patient advised

## 2022-09-21 LAB — COMPREHENSIVE METABOLIC PANEL
ALT: 14 IU/L (ref 0–32)
AST: 17 IU/L (ref 0–40)
Albumin/Globulin Ratio: 1.6 (ref 1.2–2.2)
Albumin: 4.6 g/dL (ref 3.8–4.9)
Alkaline Phosphatase: 90 IU/L (ref 44–121)
BUN/Creatinine Ratio: 19 (ref 9–23)
BUN: 19 mg/dL (ref 6–24)
Bilirubin Total: 0.5 mg/dL (ref 0.0–1.2)
CO2: 24 mmol/L (ref 20–29)
Calcium: 10.3 mg/dL — ABNORMAL HIGH (ref 8.7–10.2)
Chloride: 102 mmol/L (ref 96–106)
Creatinine, Ser: 1.02 mg/dL — ABNORMAL HIGH (ref 0.57–1.00)
Globulin, Total: 2.8 g/dL (ref 1.5–4.5)
Glucose: 90 mg/dL (ref 70–99)
Potassium: 5.1 mmol/L (ref 3.5–5.2)
Sodium: 142 mmol/L (ref 134–144)
Total Protein: 7.4 g/dL (ref 6.0–8.5)
eGFR: 64 mL/min/{1.73_m2} (ref >59)

## 2022-09-21 LAB — LIPID PANEL
Chol/HDL Ratio: 2.4 ratio (ref 0.0–4.4)
Cholesterol, Total: 246 mg/dL — ABNORMAL HIGH (ref 100–199)
HDL: 102 mg/dL (ref >39)
LDL Chol Calc (NIH): 130 mg/dL — ABNORMAL HIGH (ref 0–99)
Triglycerides: 82 mg/dL (ref 0–149)
VLDL Cholesterol Cal: 14 mg/dL (ref 5–40)

## 2022-10-06 IMAGING — CT CT RENAL STONE PROTOCOL
2 of 4 series · 17 of 46 positions shown, 19 images · non-contrast
Comparison: CT abdomen dated April 27, 2021.

CLINICAL DATA: Right flank pain.



[Series 3: ap without · axial · non-contrast · 0.83mm/px · z∈[+762,+1162]mm · 14 of 91 slices shown, 16 images]
[im 6/91  soft-tissue]
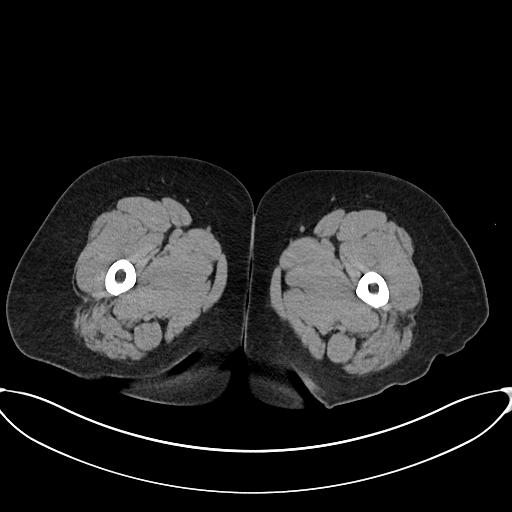
[im 6/91  bone]
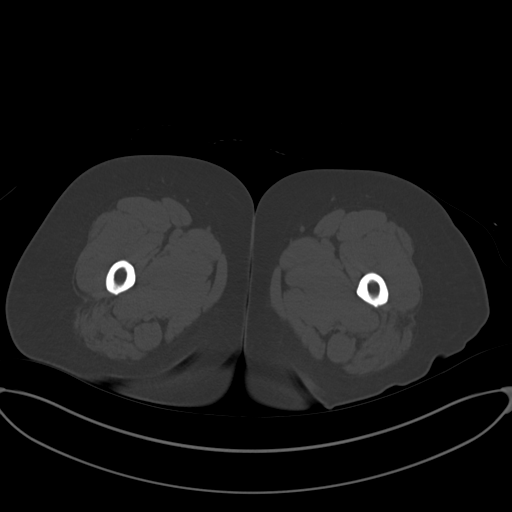
[im 11/91  soft-tissue]
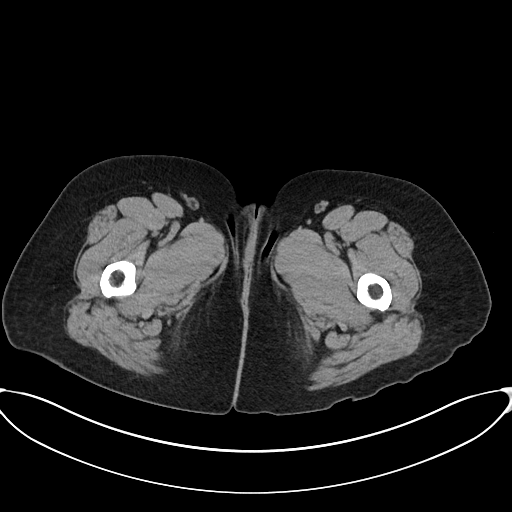
[im 21/91  soft-tissue]
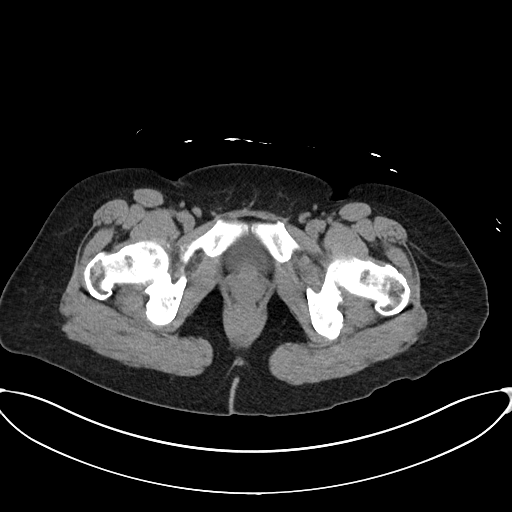
[im 26/91  soft-tissue]
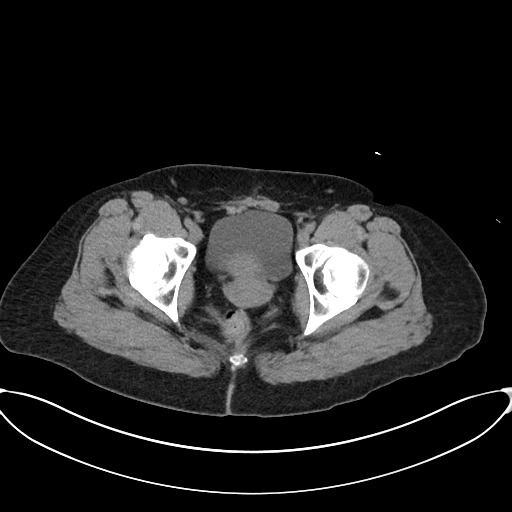
[im 31/91  soft-tissue]
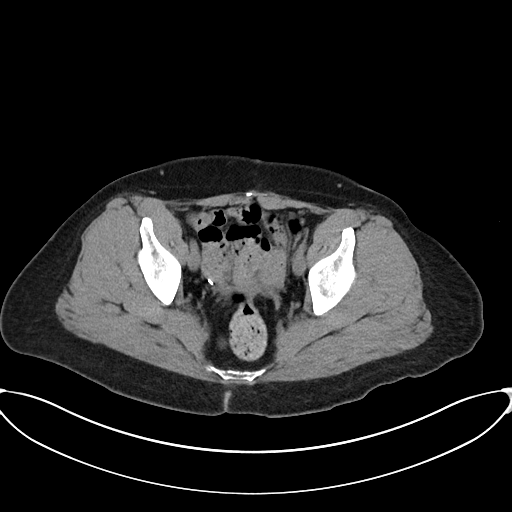
[im 36/91  soft-tissue]
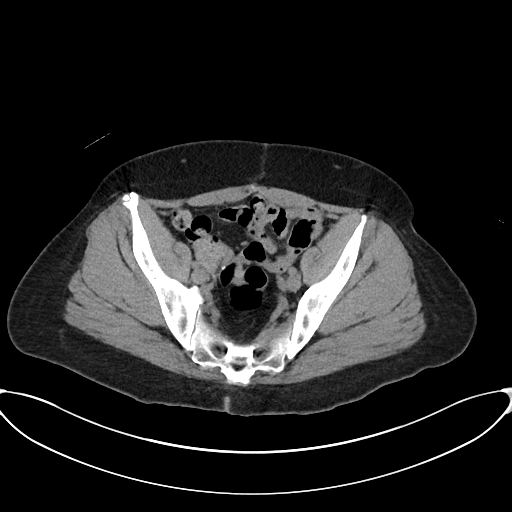
[im 41/91  soft-tissue]
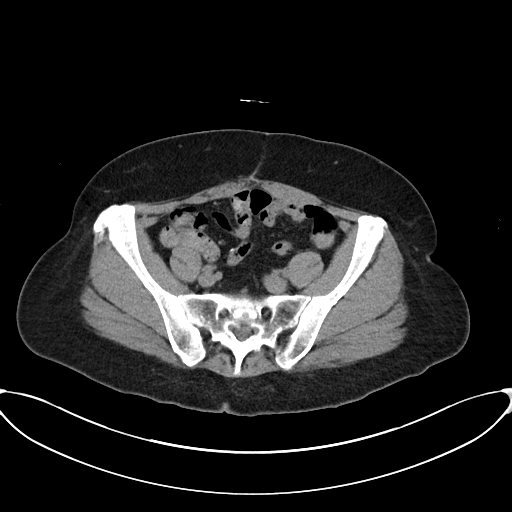
[im 51/91  soft-tissue]
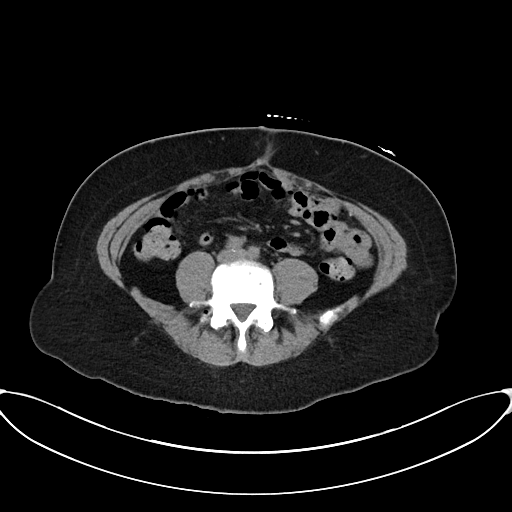
[im 56/91  soft-tissue]
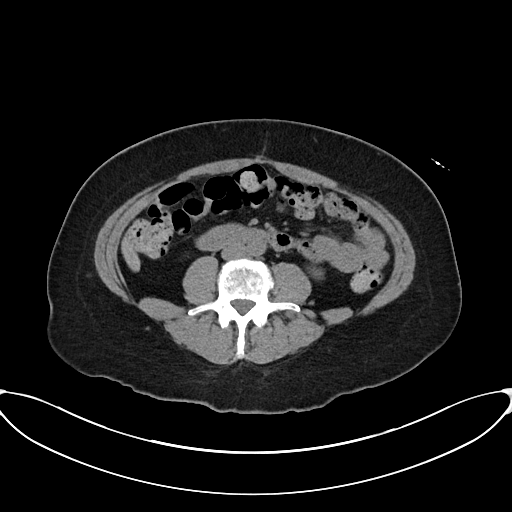
[im 56/91  bone]
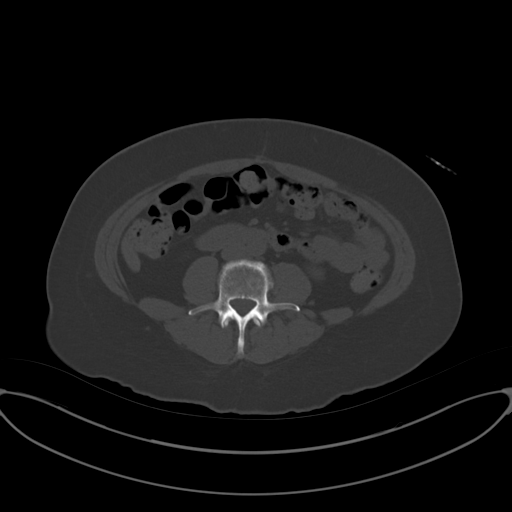
[im 61/91  soft-tissue]
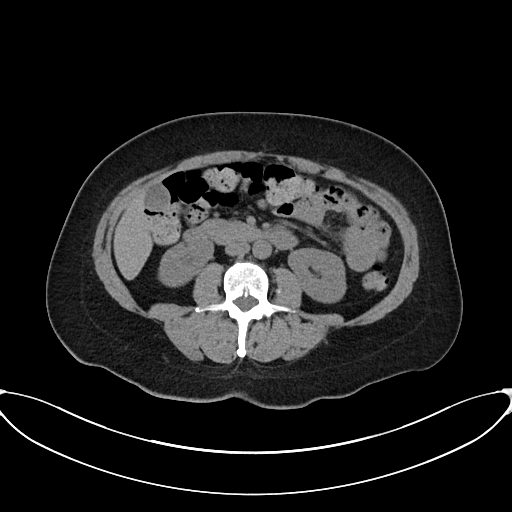
[im 66/91  soft-tissue]
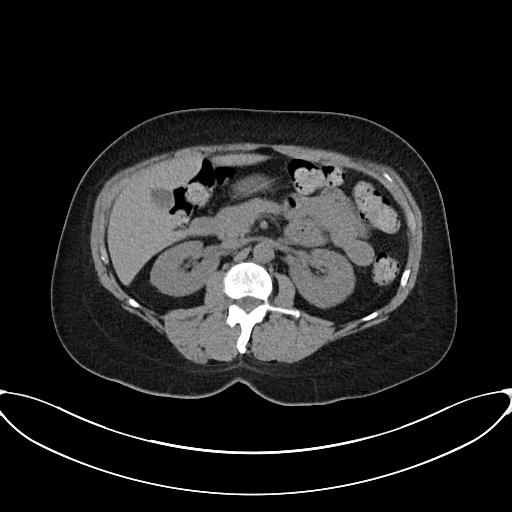
[im 71/91  soft-tissue]
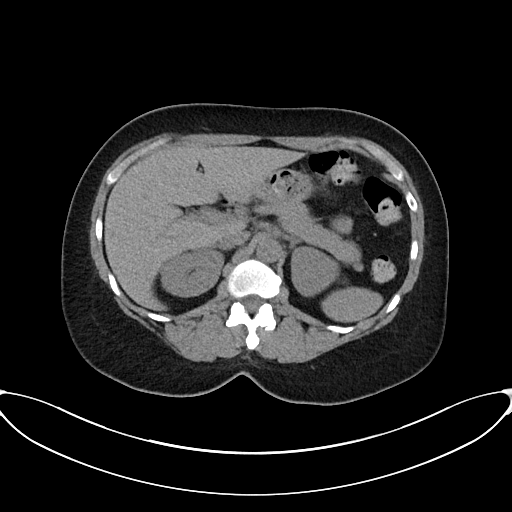
[im 81/91  soft-tissue]
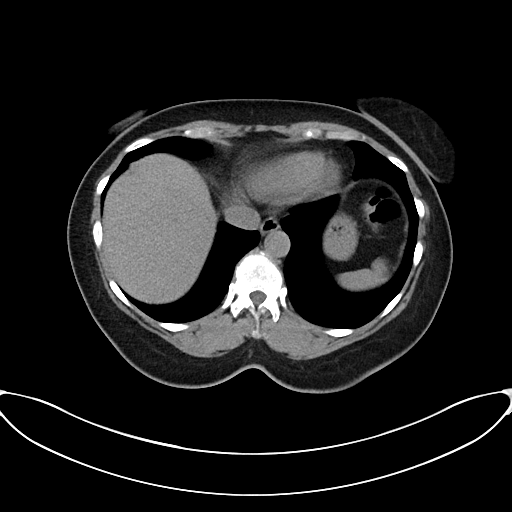
[im 86/91  soft-tissue]
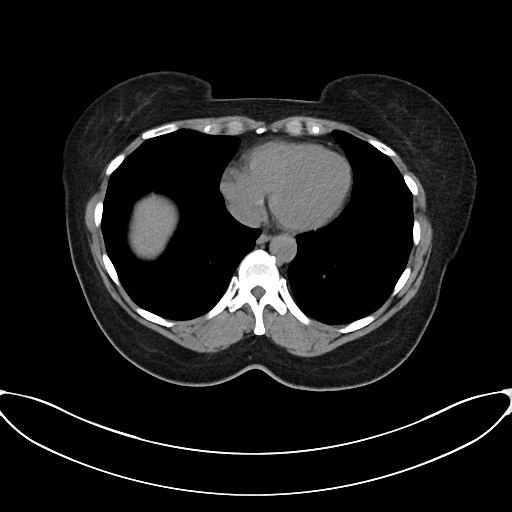

[Series 6: cor · coronal · 0.81mm/px · 3 of 101 slices shown]
[im 34/101  soft-tissue]
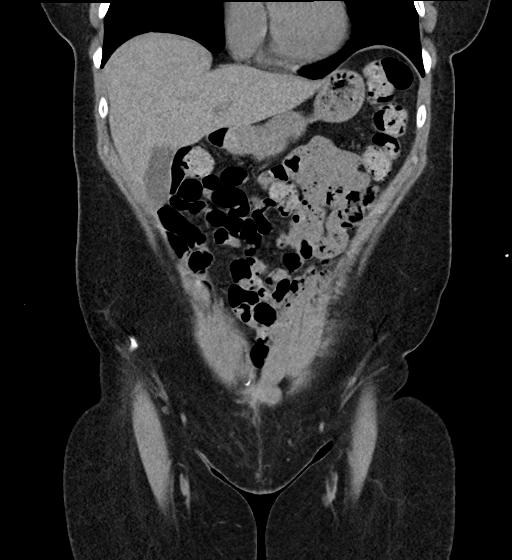
[im 45/101  soft-tissue]
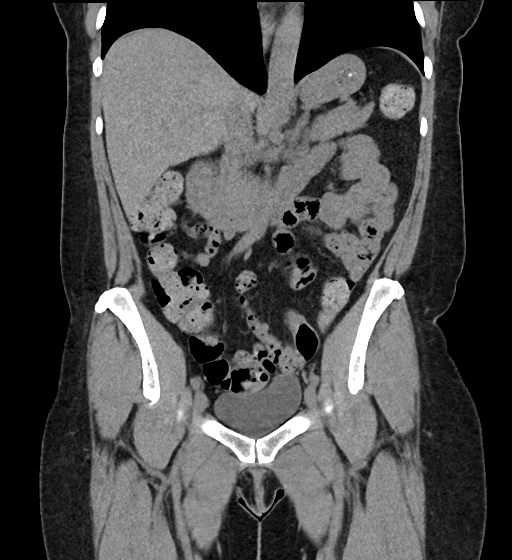
[im 56/101  soft-tissue]
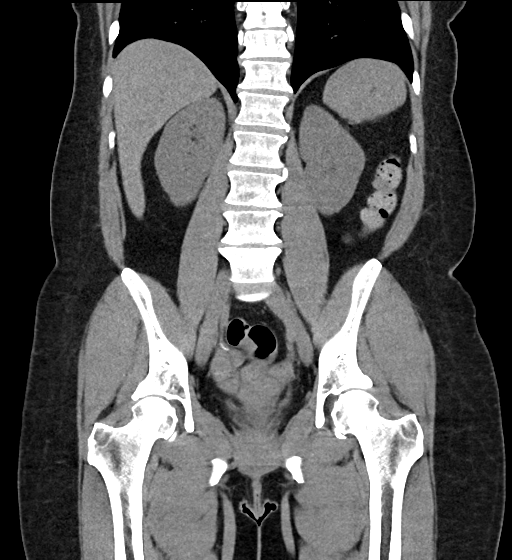

[17 of 46 positions shown; findings below may reference images not displayed]

FINDINGS: Lower chest: No acute abnormality.

Hepatobiliary: No focal liver abnormality is seen. No gallstones,
gallbladder wall thickening, or biliary dilatation.

Pancreas: Unremarkable. No pancreatic ductal dilatation or
surrounding inflammatory changes.

Spleen: Normal in size without focal abnormality.

Adrenals/Urinary Tract: Adrenal glands are unremarkable. Kidneys are
normal, without renal calculi, focal lesion, or hydronephrosis.
Bladder is unremarkable.

Stomach/Bowel: Stomach is within normal limits. Diminutive or absent
appendix. No evidence of bowel wall thickening, distention, or
inflammatory changes.

Vascular/Lymphatic: No significant vascular findings are present. No
enlarged abdominal or pelvic lymph nodes.

Reproductive: Status post hysterectomy. No adnexal masses.

Other: No abdominal wall hernia or abnormality. No abdominopelvic
ascites. No pneumoperitoneum.

Musculoskeletal: No acute or significant osseous findings.
IMPRESSION: 1. No acute intra-abdominal process.

## 2022-10-15 ENCOUNTER — Encounter (HOSPITAL_BASED_OUTPATIENT_CLINIC_OR_DEPARTMENT_OTHER): Payer: Self-pay

## 2022-10-15 MED ORDER — HYDRALAZINE HCL 25 MG PO TABS: ORAL_TABLET | ORAL | 3 refills | Status: DC

## 2022-10-17 IMAGING — CT CT ABD-PELV W/ CM
2 of 5 series · 16 of 46 positions shown, 18 images · IV contrast (agent unspecified)
Comparison: None.

CLINICAL DATA: Abdominal pain, acute, nonlocalized

EXAM:
CT ABDOMEN AND PELVIS WITH CONTRAST
TECHNIQUE: Multidetector CT imaging of the abdomen and pelvis was performed
using the standard protocol following bolus administration of
intravenous contrast.

[Series 3: abdomen 5.0 · axial · 0.79mm/px · z∈[-1041,-656]mm · 13 of 91 slices shown, 15 images]
[im 7/91  soft-tissue]
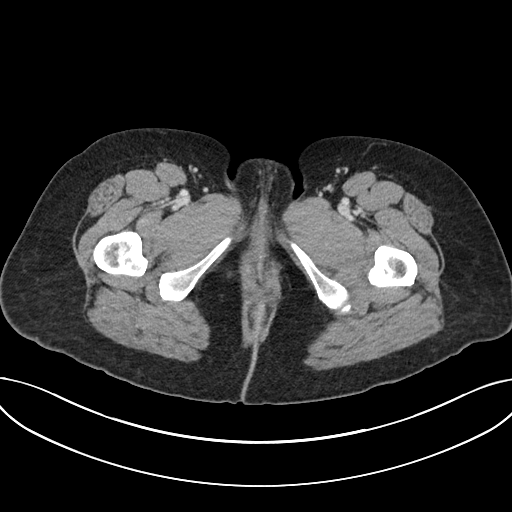
[im 7/91  bone]
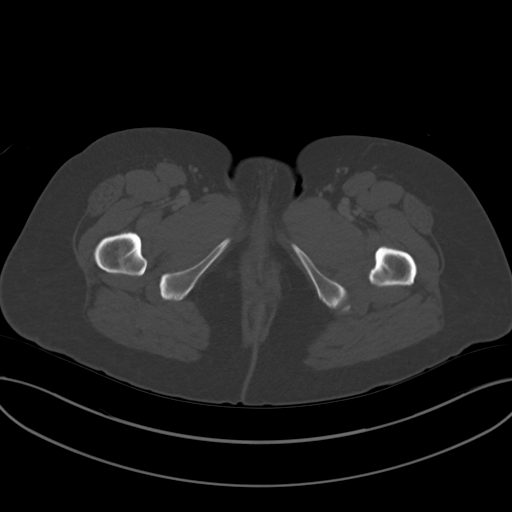
[im 13/91  soft-tissue]
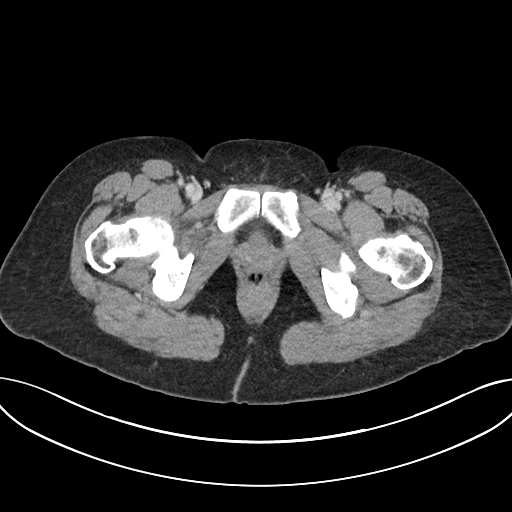
[im 20/91  soft-tissue]
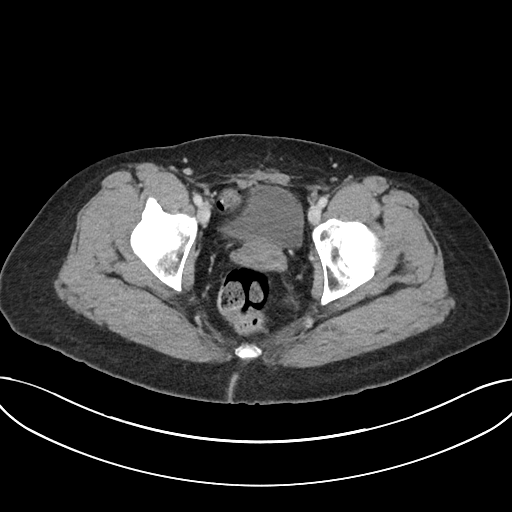
[im 26/91  soft-tissue]
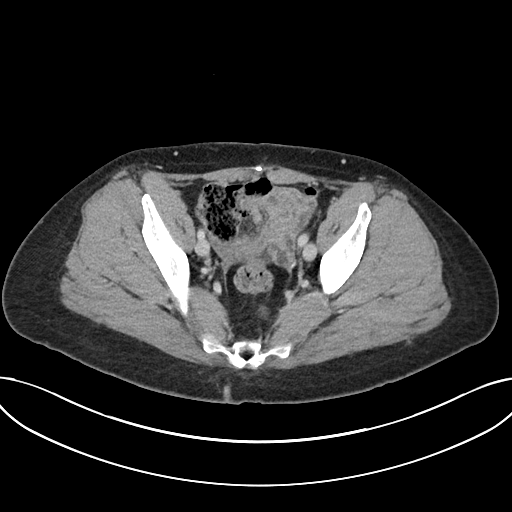
[im 33/91  soft-tissue]
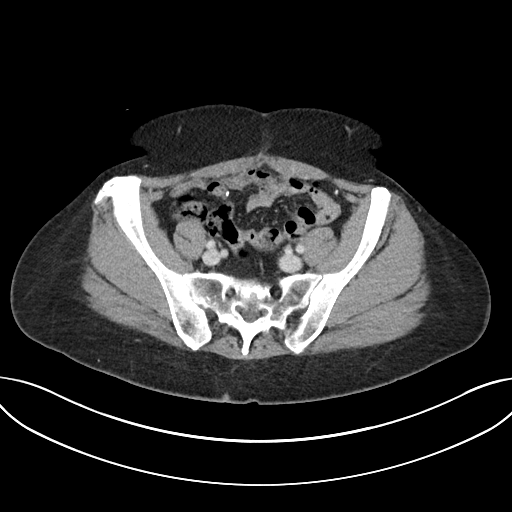
[im 39/91  soft-tissue]
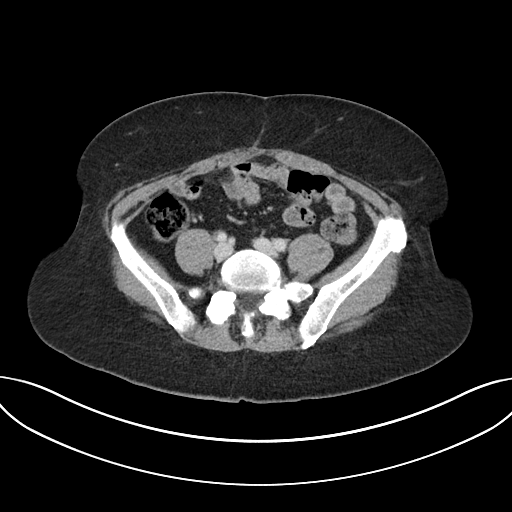
[im 46/91  soft-tissue]
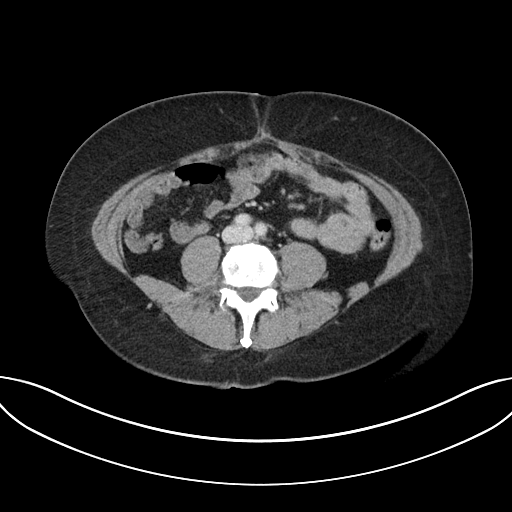
[im 52/91  soft-tissue]
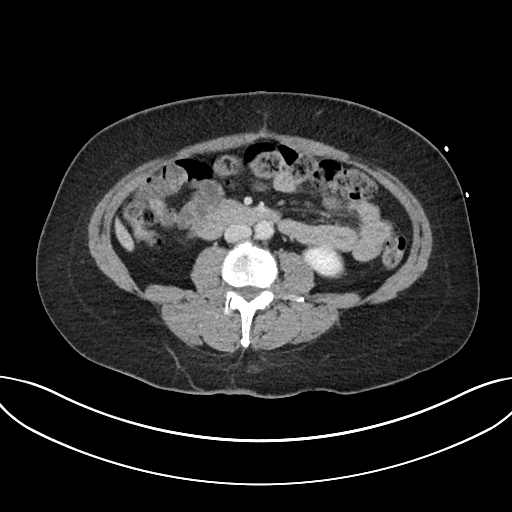
[im 58/91  soft-tissue]
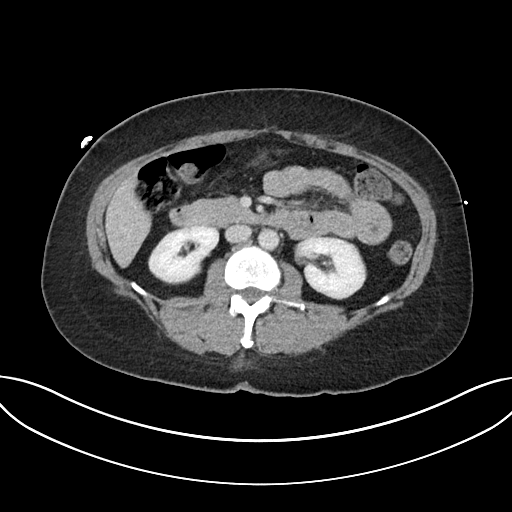
[im 58/91  bone]
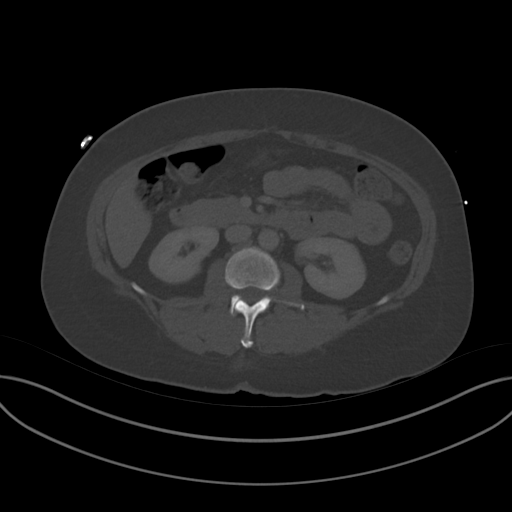
[im 65/91  soft-tissue]
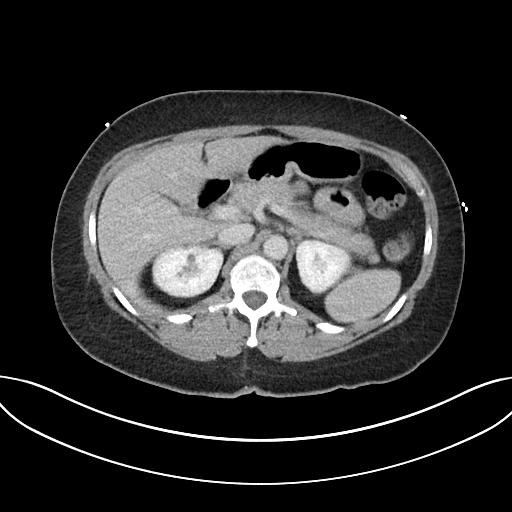
[im 71/91  soft-tissue]
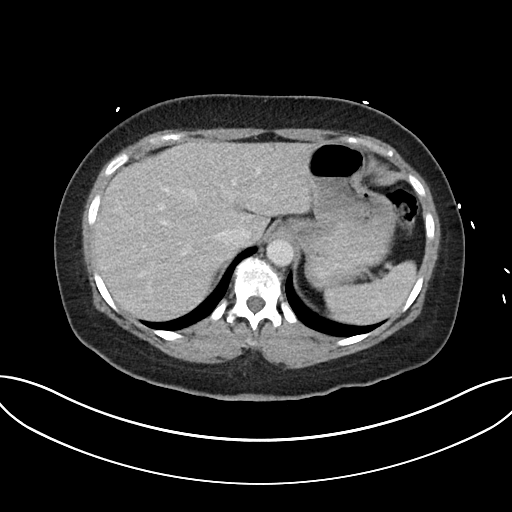
[im 78/91  soft-tissue]
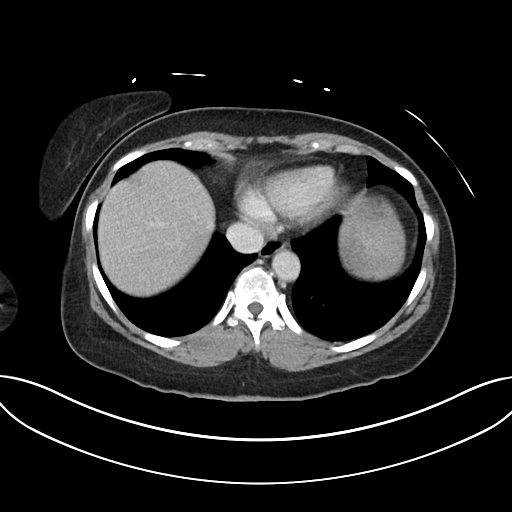
[im 84/91  soft-tissue]
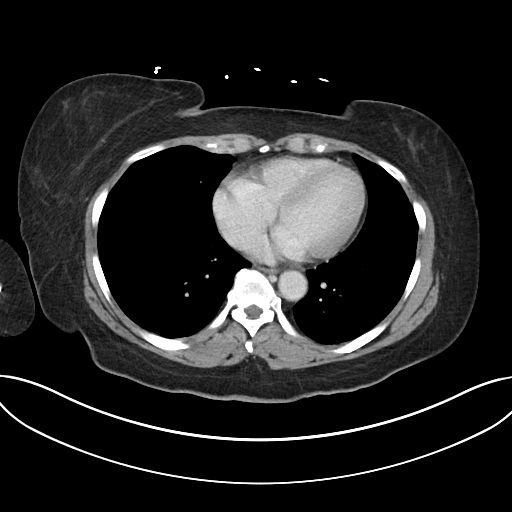

[Series 6: abdomen 3.0 mpr cor · coronal · 0.65mm/px · 3 of 88 slices shown]
[im 30/88  soft-tissue]
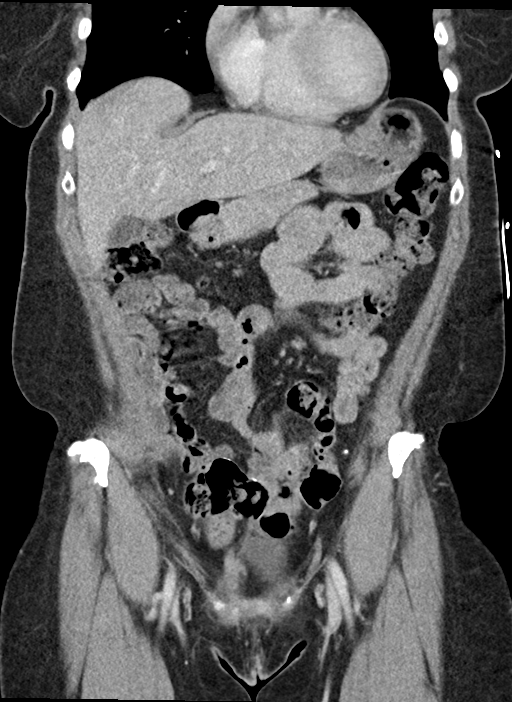
[im 39/88  soft-tissue]
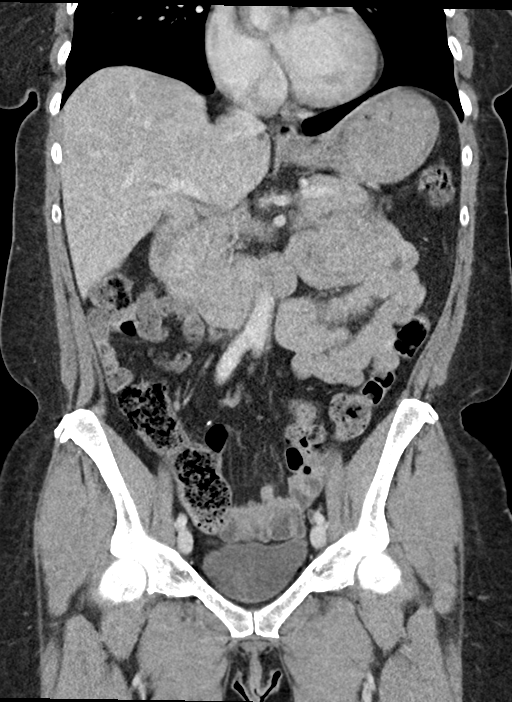
[im 49/88  soft-tissue]
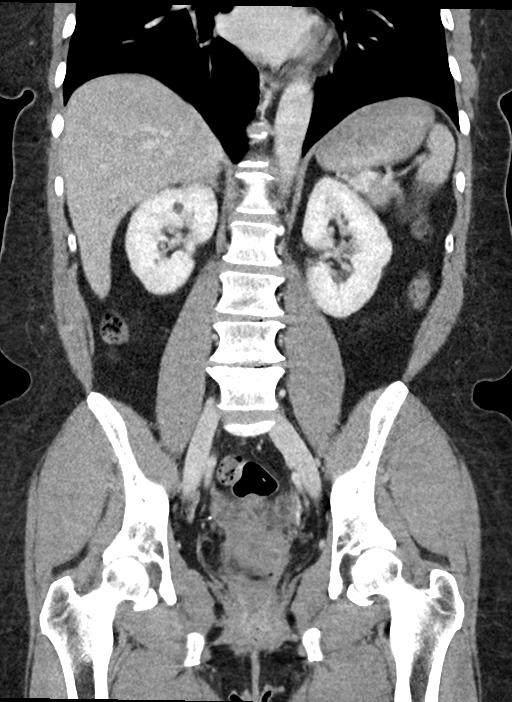

[16 of 46 positions shown; findings below may reference images not displayed]

RADIATION DOSE REDUCTION: This exam was performed according to the
departmental dose-optimization program which includes automated
exposure control, adjustment of the mA and/or kV according to
patient size and/or use of iterative reconstruction technique.

CONTRAST:  75mL OMNIPAQUE IOHEXOL 350 MG/ML SOLN
FINDINGS: Lower chest: Lung bases are clear.

Hepatobiliary: No focal hepatic lesion. No biliary duct dilatation.
Common bile duct is normal.

Pancreas: Pancreas is normal. No ductal dilatation. No pancreatic
inflammation.

Spleen: Normal spleen

Adrenals/urinary tract: Adrenal glands and kidneys are normal. The
ureters and bladder normal.

Stomach/Bowel: Stomach, small-bowel and cecum are normal. The
appendix is not identified but there is no pericecal inflammation to
suggest appendicitis. The colon and rectosigmoid colon are normal.

Vascular/Lymphatic: Abdominal aorta is normal caliber. No periportal
or retroperitoneal adenopathy. No pelvic adenopathy.

Reproductive: Post hysterectomy.  Adnexa unremarkable

Other: No free fluid.

Musculoskeletal: No aggressive osseous lesion.
IMPRESSION: 1. No acute findings in the abdomen pelvis.
2. Gallbladder normal.
3. Appendix not identified.  No secondary signs of appendicitis.
4. Post hysterectomy.
5. No bowel obstruction or inflammation.

## 2022-10-24 ENCOUNTER — Encounter: Payer: Self-pay | Admitting: *Deleted

## 2022-10-24 NOTE — Progress Notes (Unsigned)
Pt attended a screening event on 10/06/22 in [place], where BP screening result wnl. Pt confirmed Dr. Ardean Larsen, as her PCP, at [facility] and no SDOH insecurities was indicated at the screening event. Per chart review, Pt was seen by PCP on 08/02/22. Pt also had visit with Cardiologist for chronic Hypertension on 09/22/22 and has upcoming Cardiology appointments on 11/22/22 and 02/19/23. In-Basket message was sent to Pt's PCP and Cardiology providers with Pt screening result. No additional health equity support indicated at this time. Lorri Frederick, CCG

## 2022-11-20 NOTE — Progress Notes (Signed)
[facility] Assessment:    Date:  11/22/2022   ID:  Kristen Santiago, DOB Oct 15, 1965, MRN [id number]  PCP:  Ollen Bowl, MD  Cardiologist:  Chilton Si, MD  Nephrologist:  Referring MD: Ollen Bowl, MD   CC: Hypertension  History of Present Illness:    Kristen Santiago is a 57 y.o. female with a hx of HTN, HLD, PVC, fatty liver disease, anxiety here to follow up in the [facility].   Coronary calcium score 04/2022 with no coronary calcification. Established with [facility] 03/06/22. Multiple medication intolerances detailed below. She has shown white coat hypertension superimposed on essential hypertension.   Last seen 09/12/22. Due to significant stuggles with anxiety Lexapro 10mg  QD was initiated. BP was better controlled at home than in clinic with whitecoat hypertension superimposed on essential hypertension. Hydralazine and Furosemide were continued. Renal denervation was briefly discussed.   Presents today for follow up independently. Recently enjoyed going to some concerts. 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 Blood pressure at home has been 140s prior to medications and 130s after medications. Feels the Lexapro has helped a lot with her anxiety levels. Has stopped Lasix as reported it was causing side effects such as headache. Staying active participating in a yoga class.   Reports no shortness of breath nor dyspnea on exertion. Reports no chest pain, pressure, or tightness. No edema, orthopnea, PND. Reports no palpitations.    Previous antihypertensives: Lisinopril-HCTZ - indigestion Spironolactone - no clear adverse effects Amlodipine Bisoprolol HCTZ Metoprolol Losartan - switched to Olmesartan Olmesartan - lip tinging Chlorthalidone- hypercalcemia Furosemide - headache  Past Medical History:  Diagnosis Date   Anxiety    GERD (gastroesophageal reflux disease)    Weight  increased 01/12/2021    Past Surgical History:  Procedure Laterality Date   ABDOMINAL HYSTERECTOMY     ANKLE SURGERY     TONSILLECTOMY      Current Medications: Current Meds  Medication Sig   escitalopram (LEXAPRO) 10 MG tablet Take 1 tablet (10 mg total) by mouth daily.   magnesium oxide (MAG-OX) 400 MG tablet Take 200 mg by mouth every evening.   [DISCONTINUED] hydrALAZINE (APRESOLINE) 25 MG tablet TAKE (1) TABLET BY MOUTH (3) TIMES DAILY.     Allergies:   Amlodipine, Bisoprolol, Escitalopram, Floxacillin [floxacillin (flucloxacillin)], Hydrochlorothiazide, Losartan, and Olmesartan   Social History   Socioeconomic History   Marital status: Single    Spouse name: Not on file   Number of children: Not on file   Years of education: Not on file   Highest education level: Not on file  Occupational History   Not on file  Tobacco Use   Smoking status: Never   Smokeless tobacco: Never  Vaping Use   Vaping Use: Never used  Substance and Sexual Activity   Alcohol use: Not Currently   Drug use: Never   Sexual activity: Never    Birth control/protection: None  Other Topics Concern   Not on file  Social History Narrative   Not on file   Social Determinants of Health   Financial Resource Strain: Low Risk  (03/06/2022)   Overall Financial Resource Strain (CARDIA)    Difficulty of Paying Living Expenses: Not hard at all  Food Insecurity: No Food Insecurity (10/06/2022)   Hunger Vital Sign    Worried About Running Out of Food in the Last Year: Never true    Ran Out of Food in the Last Year: Never true  Transportation Needs: No Transportation Needs (10/06/2022)   PRAPARE - Administrator, Civil Service (Medical): No    Lack of Transportation (Non-Medical): No  Physical Activity: Insufficiently Active (03/06/2022)   Exercise Vital Sign    Days of Exercise per Week: 2 days    Minutes of Exercise per Session: 30 min  Stress: Not on file  Social Connections: Not on  file     Family History: The patient's family history includes Hypertension in her brother, father, mother, niece, and sister.  ROS:   Please see the history of present illness.    All other systems reviewed and are negative.  EKGs/Labs/Other Studies Reviewed:    EKG:  EKG is not ordered today.    Recent Labs: 09/14/2022: ALT 14; BUN 19; Creatinine, Ser 1.02; Potassium 5.1; Sodium 142   Recent Lipid Panel    Component Value Date/Time   CHOL 246 (H) 09/14/2022 0917   TRIG 82 09/14/2022 0917   HDL 102 09/14/2022 0917   CHOLHDL 2.4 09/14/2022 0917   CHOLHDL 3 01/02/2022 0958   VLDL 22.2 01/02/2022 0958   LDLCALC 130 (H) 09/14/2022 0917    Physical Exam:   VS:  BP (!) 142/84   Pulse (!) 102   Ht 5' 2.5" (1.588 m)   Wt 166 lb (75.3 kg)   BMI 29.88 kg/m  , BMI Body mass index is 29.88 kg/m. GENERAL:  Well appearing HEENT: Pupils equal round and reactive, fundi not visualized, oral mucosa unremarkable NECK:  No jugular venous distention, waveform within normal limits, carotid upstroke brisk and symmetric, no bruits, no thyromegaly LYMPHATICS:  No cervical adenopathy LUNGS:  Clear to auscultation bilaterally HEART:  RRR.  PMI not displaced or sustained,S1 and S2 within normal limits, no S3, no S4, no clicks, no rubs, no murmurs ABD:  Flat, positive bowel sounds normal in frequency in pitch, no bruits, no rebound, no guarding, no midline pulsatile mass, no hepatomegaly, no splenomegaly EXT:  2 plus pulses throughout, no edema, no cyanosis no clubbing SKIN:  No rashes no nodules NEURO:  Cranial nerves II through XII grossly intact, motor grossly intact throughout PSYCH:  Cognitively intact, oriented to person place and time  ASSESSMENT/PLAN:    HTN - BP elevated in clinic initially 182/104 ? 174/119 ? 142/84 without intervention. Known white coat hypertension. Has stopped Lasix due to headaches. Multiple prior intolerances. Does note difficulty with TID dosing of Hydralazine,  will adjust to 50mg  BID.   HLD - LDL goal <100. 2023 CTA with coronary calcium score of 0. No recurrent chest pain. Lipid managed through diet/exercise.   Obesity - Weight loss via diet and exercise encouraged. Discussed the impact being overweight would have on cardiovascular risk.  Given information for Right Start exercise program.   Anxiety - Much improved on Lexapro. Continue same.   Screening for Secondary Hypertension:     Relevant Labs/Studies:    Latest Ref Rng & Units 09/14/2022    9:17 AM 07/03/2022   12:27 PM 05/28/2022   10:40 AM  Basic Labs  Sodium 134 - 144 mmol/L 142  140  143   Potassium 3.5 - 5.2 mmol/L 5.1  4.5  4.8   Creatinine 0.57 - 1.00 mg/dL 9.14  7.82  9.56                      Disposition:    FU with MD/APP/PharmD in 3 months    Medication Adjustments/Labs and Tests Ordered: Current  medicines are reviewed at length with the patient today.  Concerns regarding medicines are outlined above.  No orders of the defined types were placed in this encounter.  Meds ordered this encounter  Medications   DISCONTD: hydrALAZINE (APRESOLINE) 25 MG tablet    Sig: Take 2 tablets (50 mg total) by mouth 2 (two) times daily. TAKE (1) TABLET BY MOUTH (3) TIMES DAILY.    Dispense:  180 tablet    Refill:  3    Please fill script for 270, patient was only given 90 tablets therefore her supply will run out early.   hydrALAZINE (APRESOLINE) 25 MG tablet    Sig: Take 2 tablets (50 mg total) by mouth 2 (two) times daily. TAKE (1) TABLET BY MOUTH (3) TIMES DAILY.    Dispense:  180 tablet    Refill:  3     Signed, Alver Sorrow, NP  11/22/2022 6:22 PM    [facility]

## 2022-11-22 ENCOUNTER — Ambulatory Visit (HOSPITAL_BASED_OUTPATIENT_CLINIC_OR_DEPARTMENT_OTHER): Payer: BC Managed Care – PPO | Admitting: Family

## 2022-11-22 ENCOUNTER — Encounter (HOSPITAL_BASED_OUTPATIENT_CLINIC_OR_DEPARTMENT_OTHER): Payer: Self-pay | Admitting: Family

## 2022-11-22 ENCOUNTER — Encounter (HOSPITAL_BASED_OUTPATIENT_CLINIC_OR_DEPARTMENT_OTHER): Payer: Self-pay

## 2022-11-22 VITALS — BP 142/84 | HR 102 | Ht 62.5 in | Wt 166.0 lb

## 2022-11-22 DIAGNOSIS — E782 Mixed hyperlipidemia: Secondary | ICD-10-CM | POA: Diagnosis not present

## 2022-11-22 DIAGNOSIS — F411 Generalized anxiety disorder: Secondary | ICD-10-CM

## 2022-11-22 DIAGNOSIS — I10 Essential (primary) hypertension: Secondary | ICD-10-CM | POA: Diagnosis not present

## 2022-11-22 MED ORDER — HYDRALAZINE HCL 25 MG PO TABS: 50.0000 mg | ORAL_TABLET | Freq: Two times a day (BID) | ORAL | 3 refills | Status: DC

## 2022-11-22 NOTE — Patient Instructions (Addendum)
Medication Instructions:  Your physician has recommended you make the following change in your medication:  CHANGE Hydralazine to 50mg  twice per day You may use up your 25mg  tablets by taking two tablets twice per day   Follow-Up: As scheduled with Dr. Duke Salvia    Special Instructions:

## 2022-12-03 ENCOUNTER — Encounter (HOSPITAL_BASED_OUTPATIENT_CLINIC_OR_DEPARTMENT_OTHER): Payer: Self-pay

## 2022-12-03 NOTE — Telephone Encounter (Signed)
BP log as requested  

## 2022-12-26 ENCOUNTER — Other Ambulatory Visit: Payer: Self-pay | Admitting: Obstetrics and Gynecology

## 2022-12-26 DIAGNOSIS — Z0000 Encounter for general adult medical examination without abnormal findings: Secondary | ICD-10-CM

## 2022-12-31 MED ORDER — HYDRALAZINE HCL 50 MG PO TABS: 50.0000 mg | ORAL_TABLET | Freq: Two times a day (BID) | ORAL | 3 refills | Status: DC

## 2023-02-19 ENCOUNTER — Encounter (HOSPITAL_BASED_OUTPATIENT_CLINIC_OR_DEPARTMENT_OTHER): Payer: BC Managed Care – PPO | Admitting: Cardiovascular Disease

## 2023-03-11 ENCOUNTER — Ambulatory Visit
Admission: RE | Admit: 2023-03-11 | Discharge: 2023-03-11 | Disposition: A | Discharge status: Home / Self Care | Payer: BC Managed Care – PPO | Source: Ambulatory Visit | Attending: Obstetrics and Gynecology | Admitting: Obstetrics and Gynecology

## 2023-03-11 DIAGNOSIS — Z0000 Encounter for general adult medical examination without abnormal findings: Secondary | ICD-10-CM

## 2023-03-20 ENCOUNTER — Ambulatory Visit
Admission: RE | Admit: 2023-03-20 | Discharge: 2023-03-20 | Disposition: A | Discharge status: Home / Self Care | Payer: BC Managed Care – PPO | Source: Ambulatory Visit | Attending: Internal Medicine | Admitting: Internal Medicine

## 2023-03-20 DIAGNOSIS — Z13820 Encounter for screening for osteoporosis: Secondary | ICD-10-CM

## 2023-04-03 ENCOUNTER — Ambulatory Visit (HOSPITAL_BASED_OUTPATIENT_CLINIC_OR_DEPARTMENT_OTHER): Payer: BC Managed Care – PPO | Admitting: Cardiovascular Disease

## 2023-04-03 ENCOUNTER — Encounter (HOSPITAL_BASED_OUTPATIENT_CLINIC_OR_DEPARTMENT_OTHER): Payer: Self-pay | Admitting: Cardiovascular Disease

## 2023-04-03 VITALS — BP 144/96 | HR 100 | Ht 62.5 in | Wt 171.2 lb

## 2023-04-03 DIAGNOSIS — I10 Essential (primary) hypertension: Secondary | ICD-10-CM | POA: Diagnosis not present

## 2023-04-03 DIAGNOSIS — I493 Ventricular premature depolarization: Secondary | ICD-10-CM

## 2023-04-03 DIAGNOSIS — K760 Fatty (change of) liver, not elsewhere classified: Secondary | ICD-10-CM

## 2023-04-03 DIAGNOSIS — E782 Mixed hyperlipidemia: Secondary | ICD-10-CM

## 2023-04-03 NOTE — Progress Notes (Deleted)
Cardiology Office Note:    Date:  04/03/2023   ID:  [name], DOB 1965-07-18, MRN [id number]  PCP:  Ollen Bowl, MD   [facility] Providers Cardiologist:  Chilton Si, MD     Referring MD: Ollen Bowl, MD   No chief complaint on file.   History of Present Illness:    Henrie Mooring is a 57 y.o. female with a hx of hyperlipidemia, PVC's, fatty liver disease,  here for uncontrolled hypertension. She saw her PCP on 12/2021 and complained of dizziness. She also had headaches after chlorthalidone was added to her regimen. She was instructed to add losartan if her BP was over 125/90 and referred to cardiology. Her blood pressure in the office was 154/84.  Previously she was taking lisinopril-HCTZ, but she began having indigestion and was "not agreeing with her." She was started on 25 mg olmesartan which was helping with no headaches or dizziness. At one time she was started on spironolactone, but she is not certain why. She had experienced palpitations so she was prescribed metoprolol.  She is very physically active and jogs regularly.  She also has a healthy diet.  At her visit 02/2022 losartan was switched to olmesartan given that she previously tolerated it.  However she called the office 03/2022 reporting that it was causing lip tingling.  This was switched to hydralazine..  She had a coronary calcium score 04/2022 that revealed no coronary calcification.  She followed up with our pharmacist 03/2022 and blood pressures remained poorly controlled.  Hydralazine was increased to 3 times daily.  At follow-up 04/2022 blood pressure remained uncontrolled and she was started on chlorthalidone.  At her follow up 09/2022 BP was in the 130/80s at home and higher in the office.  She stopped taking chlorthalidone due to hypercalcemia. She started on lasix.  She continued to struggle with PVCs and thought anxiety may be contributing.   She was started on Lexapro and therapy was  recommended.  She saw Gillian Shields, NP 11/2022 and BP was in the 130-140s at home.  Her anxiety was better controlled.  Hydralazine was changed to bid for ease of administration.    Prior antihypertensives: Lisinopril/HCTZ Amlodipine Bisoprolol HCTZ Olmesartan- lip tingling Metoprolol Spironolactone-no clear adverse effects Chlorthalidone   Past Medical History:  Diagnosis Date   Anxiety    GERD (gastroesophageal reflux disease)    Weight increased 01/12/2021    Past Surgical History:  Procedure Laterality Date   ABDOMINAL HYSTERECTOMY     ANKLE SURGERY     TONSILLECTOMY      Current Medications: Current Meds  Medication Sig   hydrALAZINE (APRESOLINE) 50 MG tablet Take 1 tablet (50 mg total) by mouth 2 (two) times daily. TAKE (1) TABLET BY MOUTH (3) TIMES DAILY.   magnesium oxide (MAG-OX) 400 MG tablet Take 200 mg by mouth every evening.   [DISCONTINUED] escitalopram (LEXAPRO) 10 MG tablet Take 1 tablet (10 mg total) by mouth daily.     Allergies:   Amlodipine, Bisoprolol, Escitalopram, Floxacillin [floxacillin (flucloxacillin)], Hydrochlorothiazide, Losartan, and Olmesartan   Social History   Socioeconomic History   Marital status: Single    Spouse name: Not on file   Number of children: Not on file   Years of education: Not on file   Highest education level: Not on file  Occupational History   Not on file  Tobacco Use   Smoking status: Never   Smokeless tobacco: Never  Vaping Use   Vaping status: Never Used  Substance and Sexual Activity   Alcohol use: Not Currently   Drug use: Never   Sexual activity: Never    Birth control/protection: None  Other Topics Concern   Not on file  Social History Narrative   Not on file   Social Determinants of Health   Financial Resource Strain: Low Risk  (03/06/2022)   Overall Financial Resource Strain (CARDIA)    Difficulty of Paying Living Expenses: Not hard at all  Food Insecurity: No Food Insecurity (10/06/2022)    Hunger Vital Sign    Worried About Running Out of Food in the Last Year: Never true    Ran Out of Food in the Last Year: Never true  Transportation Needs: No Transportation Needs (10/06/2022)   PRAPARE - Administrator, Civil Service (Medical): No    Lack of Transportation (Non-Medical): No  Physical Activity: Insufficiently Active (03/06/2022)   Exercise Vital Sign    Days of Exercise per Week: 2 days    Minutes of Exercise per Session: 30 min  Stress: Not on file  Social Connections: Unknown (10/10/2021)   Received from [facility]   Social Network    Social Network: Not on file     Family History: The patient's family history includes Hypertension in her brother, father, mother, niece, and sister.  ROS:   Please see the history of present illness.    (+) Headaches (+) Arthralgias (+) Numbness of bilateral toes All other systems reviewed and are negative.  EKGs/Labs/Other Studies Reviewed:    The following studies were reviewed today:  Monitor 11/2021  ([facility]): Patient was wearing extended 3 day Holter monitor starting on Oct 25, 2021.   Indication: PVCs.   Patient remained in sinus rhythm with minimum HR 61 at 5:33 AM on May 18, max HR 142 at 12:57 PM on May 19.  Average HR 87.   PVCs noted representing  5.3% of total beats. 192 couplets of PVCs noted.  250 PVCs noted in bigeminal pattern. Almost 7,400 PVCs noted in trigeminal pattern representing about 2% of total beats.   Only 23 supraventricular ectopic beats noted representing  <1% of total beats.   2 runs of narrow complex tachycardia noted. Patient remained asymptomatic during these episodes.  The fastest run was 4 beat run with HR 133 at 3:25 AM on May 18.  The longest run 16 beat run with HR 119 at 3:25 AM on May 18.   Np patient triggered episodes were recorded.     No data is available how long the patient was tachycardic or bradycardic.   The longest R to R interval 1 second  at 5:33 AM on May 18.   No A. fib, no VT, no long pauses 3 seconds or more noted.  Echo  10/23/2021  ([facility]): Left Ventricle: Systolic function is normal. EF: 55-59%.    Left Ventricle: Wall motion is normal.    Left Ventricle: Doppler parameters indicate normal diastolic function.    Aortic Valve: Trace aortic valve regurgitation.    Tricuspid Valve: The right ventricular systolic pressure is normal (<36  mmHg).  Exercise Stress Test  12/27/2020  ([facility]): Left Ventricle: Systolic function is normal. EF: 60%.    Left Ventricle: Wall motion is normal.    Left Ventricle: Doppler parameters are indeterminate for diastolic  function.    Tricuspid Valve: Unable to assess RVSP accurately without IVC  visualization but considering TR jet velocity only 1.3 m/s, RVSP is likely  within normal  limits.    Post-stress: The left ventricle systolic function is hyperdynamic  post-stress with an EF of 70 - 72 %.    Post-stress: The post-stress echo showed normal wall motion which was  hyperdynamic compared to baseline.    Stress ECG: ECG Conclusion:  No ischemic ST segment changes occurred  with stress in the first 4 minutes in recovery.  At 5-minute in recovery  slight nonspecific 0.5 mm ST segment changes noted as above. No chest  pain. Normal functional capacity for age and gender with estimated  workload 7.2 METS.    Stress ECG: No ischemic ST segment changes occurred during exercise and  the first 4 minutes in recovery.  At 5-minute in recovery 0.5 mm flat ST  depression noted in leads III, aVF, V4, V5 and V6.    Stress ECG: No arrhythmias noted in the first 2 minutes in recovery.    At 3 minutes in recovery frequent PVCs noted some PVCs in trigeminal  pattern on and off up to 15 minutes in recovery.  At 16 minutes in  recovery PVCs got resolved.  Patient denied any palpitations, dizziness,  near-syncope or syncope.  Patient has not taken bisoprolol this morning  because of upcoming stress  test.  She will take bisoprolol when she gets  home after stress test in 20 minutes as per patient.    Post-stress Impression: The study is normal.    Post-stress Impression: This study shows a low prognostic risk.   No significant valvular disease noted.  EKG:   EKG is personally reviewed. 03/06/2022: Sinus rhythm. Rate 83 bpm. 09/12/22: Sinus rhythm.  Rate 90 bpm.  PVCs.   Recent Labs: 09/14/2022: ALT 14; BUN 19; Creatinine, Ser 1.02; Potassium 5.1; Sodium 142   Recent Lipid Panel    Component Value Date/Time   CHOL 246 (H) 09/14/2022 0917   TRIG 82 09/14/2022 0917   HDL 102 09/14/2022 0917   CHOLHDL 2.4 09/14/2022 0917   CHOLHDL 3 01/02/2022 0958   VLDL 22.2 01/02/2022 0958   LDLCALC 130 (H) 09/14/2022 0917     Risk Assessment/Calculations:          HYPERTENSION CONTROL Vitals:   04/03/23 1620 04/03/23 1636  BP: (!) 171/103 (!) 144/96    The patient's blood pressure is elevated above target today. {Click here if intervention needs to be changed Refresh Note :1}  In order to address the patient's elevated BP:      Physical Exam:    Wt Readings from Last 3 Encounters:  04/03/23 171 lb 3.2 oz (77.7 kg)  11/22/22 166 lb (75.3 kg)  09/12/22 167 lb 6.4 oz (75.9 kg)     VS:  BP (!) 144/96 (BP Location: Right Arm, Patient Position: Sitting, Cuff Size: Large)   Pulse 100   Ht 5' 2.5" (1.588 m)   Wt 171 lb 3.2 oz (77.7 kg)   SpO2 98%   BMI 30.81 kg/m  , BMI Body mass index is 30.81 kg/m. GENERAL:  Well appearing HEENT: Pupils equal round and reactive, fundi not visualized, oral mucosa unremarkable NECK:  No jugular venous distention, waveform within normal limits, carotid upstroke brisk and symmetric, no bruits, no thyromegaly LUNGS:  Clear to auscultation bilaterally HEART:  RRR with one PVC.  PMI not displaced or sustained,S1 and S2 within normal limits, no S3, no S4, no clicks, no rubs, no murmurs ABD:  Flat, positive bowel sounds normal in frequency in pitch, no  bruits, no rebound, no guarding, no midline  pulsatile mass, no hepatomegaly, no splenomegaly EXT:  2 plus pulses throughout, no edema, no cyanosis no clubbing SKIN:  No rashes no nodules NEURO:  Cranial nerves II through XII grossly intact, motor grossly intact throughout PSYCH:  Cognitively intact, oriented to person place and time   ASSESSMENT:    1. Chronic hypertension   2. Frequent PVCs   3. Fatty liver   4. Mixed dyslipidemia      PLAN:    # Hypertension Elevated blood pressure readings in the office, but home readings are generally within normal range. Patient reports feeling unsteady with increased medication dosage. -Continue current antihypertensive regimen. -Encourage home blood pressure monitoring and report any consistently high readings. -Increase exercise to at least 150 minutes  # Adverse reaction to Lexapro Patient reports feeling "out of body" and unsteady after starting Lexapro, leading to increased food intake and weight gain. Patient has since stopped taking Lexapro and reports feeling more stable. -Discontinue Lexapro. -Monitor mood and report any changes.  # Weight gain Patient reports weight gain, likely secondary to increased food intake while on Lexapro. Patient acknowledges increased intake of fried foods and carbohydrates. -Encourage return to healthier eating habits, focusing on a Mediterranean diet and limiting fried foods and carbohydrates. -Encourage regular exercise, including walking or running as tolerated.  Follow-up in 6 months, or sooner if needed.      Disposition: FU with APP/PharmD in 1 month for the next 3 months. FU with Hiba Garry C. Duke Salvia, MD, [facility] in 6 months.  Medication Adjustments/Labs and Tests Ordered: Current medicines are reviewed at length with the patient today.  Concerns regarding medicines are outlined above.   No orders of the defined types were placed in this encounter.  No orders of the defined types were placed  in this encounter.  Patient Instructions  Medication Instructions:  Your physician recommends that you continue on your current medications as directed. Please refer to the Current Medication list given to you today.  Labwork: NONE  Testing/Procedures: NONE  Follow-Up: Your physician wants you to follow-up in: 6 MONTHS  You will receive a reminder letter in the mail two months in advance. If you don't receive a letter, please call our office to schedule the follow-up appointment.   Any Other Special Instructions Will Be Listed Below (If Applicable). Exercise recommendations: The American Heart Association recommends 150 minutes of moderate intensity exercise weekly. Try 30 minutes of moderate intensity exercise 4-5 times per week. This could include walking, jogging, or swimming.  WATCH CARBOHYDRATE INTAKE    Time spent: 45 minutes-Greater than 50% of this time was spent in counseling, explanation of diagnosis, planning of further management, and coordination of care.   Signed, Chilton Si, MD  04/03/2023 4:51 PM    [facility]

## 2023-04-03 NOTE — Progress Notes (Signed)
[facility] Follow-up:    Date:  04/03/2023   ID:  Kristen Santiago, DOB [birth year], MRN [id number]  PCP:  Ollen Bowl, MD  Cardiologist:  Chilton Si, MD  Nephrologist:  Referring MD: Ollen Bowl, MD   CC: Hypertension  History of Present Illness:    Kristen Santiago is a 57 y.o. female with a hx of hyperlipidemia, PVC's, fatty liver disease,  here for uncontrolled hypertension. She saw her PCP on 12/2021 and complained of dizziness. She also had headaches after chlorthalidone was added to her regimen. She was instructed to add losartan if her BP was over 125/90 and referred to cardiology. Her blood pressure in the office was 154/84.   Previously she was taking lisinopril-HCTZ, but she began having indigestion and was "not agreeing with her." She was started on 25 mg olmesartan which was helping with no headaches or dizziness. At one time she was started on spironolactone, but she is not certain why. She had experienced palpitations so she was prescribed metoprolol.  She is very physically active and jogs regularly.  She also has a healthy diet.  At her visit 02/2022 losartan was switched to olmesartan given that she previously tolerated it.  However she called the office 03/2022 reporting that it was causing lip tingling.  This was switched to hydralazine..  She had a coronary calcium score 04/2022 that revealed no coronary calcification.  She followed up with our pharmacist 03/2022 and blood pressures remained poorly controlled.  Hydralazine was increased to 3 times daily.  At follow-up 04/2022 blood pressure remained uncontrolled and she was started on chlorthalidone.   At her follow up 09/2022 BP was in the 130/80s at home and higher in the office.  She stopped taking chlorthalidone due to hypercalcemia. She started on lasix.  She continued to struggle with PVCs and thought anxiety may be contributing.   She was started on Lexapro and therapy was recommended.   She saw Gillian Shields, NP 11/2022 and BP was in the 130-140s at home.  Her anxiety was better controlled.  Hydralazine was changed to bid for ease of administration.   Today, in the office her blood pressure is elevated to 171/103 initially. She brought her home BP cuff and measured her BP at 180 systolic during our visit. Subsequently on manual recheck her blood pressure improved to 144/96. At home, her readings have ranged from the 110s-140s, mostly in the 130s-140s systolic. She did have a reading of 129/60 on 10/15, and 111/46 on 10/21. She reports that she has pushed back and eventually stopped the Lexapro, as she was experiencing "out-of-body" sensations. She also noted gaining 5-10 lbs while on Lexapro, as she had felt unstable and was trying to eat more so her head wouldn't feel woozy. The last time she took Lexapro was in September. Since then, overall her mood has been stable. Lately, she has been consuming more bread due to eating sandwiches. She continues to monitor her sodium intake. She will plan to work on increasing her exercise. She does walk the dogs, not necessarily at a brisk pace. Also she had been limited by hip pain but this is improving since working with a chiropractor for the past year. She denies any palpitations, chest pain, shortness of breath, peripheral edema, headaches, syncope, orthopnea, or PND.  Prior antihypertensives: Lisinopril/HCTZ Amlodipine Bisoprolol HCTZ Olmesartan- lip tingling Metoprolol Spironolactone-no clear adverse effects Chlorthalidone  Past Medical History:  Diagnosis Date   Anxiety    GERD (gastroesophageal reflux  disease)    Weight increased 01/12/2021    Past Surgical History:  Procedure Laterality Date   ABDOMINAL HYSTERECTOMY     ANKLE SURGERY     TONSILLECTOMY      Current Medications: Current Meds  Medication Sig   hydrALAZINE (APRESOLINE) 50 MG tablet Take 1 tablet (50 mg total) by mouth 2 (two) times daily. TAKE (1) TABLET BY  MOUTH (3) TIMES DAILY.   magnesium oxide (MAG-OX) 400 MG tablet Take 200 mg by mouth every evening.   [DISCONTINUED] escitalopram (LEXAPRO) 10 MG tablet Take 1 tablet (10 mg total) by mouth daily.     Allergies:   Amlodipine, Bisoprolol, Escitalopram, Floxacillin [floxacillin (flucloxacillin)], Hydrochlorothiazide, Losartan, and Olmesartan   Social History   Socioeconomic History   Marital status: Single    Spouse name: Not on file   Number of children: Not on file   Years of education: Not on file   Highest education level: Not on file  Occupational History   Not on file  Tobacco Use   Smoking status: Never   Smokeless tobacco: Never  Vaping Use   Vaping status: Never Used  Substance and Sexual Activity   Alcohol use: Not Currently   Drug use: Never   Sexual activity: Never    Birth control/protection: None  Other Topics Concern   Not on file  Social History Narrative   Not on file   Social Determinants of Health   Financial Resource Strain: Low Risk  (03/06/2022)   Overall Financial Resource Strain (CARDIA)    Difficulty of Paying Living Expenses: Not hard at all  Food Insecurity: No Food Insecurity (10/06/2022)   Hunger Vital Sign    Worried About Running Out of Food in the Last Year: Never true    Ran Out of Food in the Last Year: Never true  Transportation Needs: No Transportation Needs (10/06/2022)   PRAPARE - Administrator, Civil Service (Medical): No    Lack of Transportation (Non-Medical): No  Physical Activity: Insufficiently Active (03/06/2022)   Exercise Vital Sign    Days of Exercise per Week: 2 days    Minutes of Exercise per Session: 30 min  Stress: Not on file  Social Connections: Unknown (10/10/2021)   Received from [facility]   Social Network    Social Network: Not on file     Family History: The patient's family history includes Hypertension in her brother, father, mother, niece, and sister.  ROS:   Please see the  history of present illness.    All other systems reviewed and are negative.  EKGs/Labs/Other Studies Reviewed:    CT Cardiac Scoring  04/20/2022: IMPRESSION: Coronary calcium score of 0.  Monitor 11/2021  ([facility]): Patient was wearing extended 3 day Holter monitor starting on Oct 25, 2021.   Indication: PVCs.   Patient remained in sinus rhythm with minimum HR 61 at 5:33 AM on May 18, max HR 142 at 12:57 PM on May 19.  Average HR 87.   PVCs noted representing  5.3% of total beats. 192 couplets of PVCs noted.  250 PVCs noted in bigeminal pattern. Almost 7,400 PVCs noted in trigeminal pattern representing about 2% of total beats.   Only 23 supraventricular ectopic beats noted representing  <1% of total beats.   2 runs of narrow complex tachycardia noted. Patient remained asymptomatic during these episodes.  The fastest run was 4 beat run with HR 133 at 3:25 AM on May 18.  The longest  run 16 beat run with HR 119 at 3:25 AM on May 18.   Np patient triggered episodes were recorded.     No data is available how long the patient was tachycardic or bradycardic.   The longest R to R interval 1 second at 5:33 AM on May 18.   No A. fib, no VT, no long pauses 3 seconds or more noted.   Echo  10/23/2021  ([facility]): Left Ventricle: Systolic function is normal. EF: 55-59%.    Left Ventricle: Wall motion is normal.    Left Ventricle: Doppler parameters indicate normal diastolic function.    Aortic Valve: Trace aortic valve regurgitation.    Tricuspid Valve: The right ventricular systolic pressure is normal (<36  mmHg).   Exercise Stress Test  12/27/2020  ([facility]): Left Ventricle: Systolic function is normal. EF: 60%.    Left Ventricle: Wall motion is normal.    Left Ventricle: Doppler parameters are indeterminate for diastolic  function.    Tricuspid Valve: Unable to assess RVSP accurately without IVC  visualization but considering TR jet velocity only 1.3 m/s, RVSP is likely  within  normal limits.    Post-stress: The left ventricle systolic function is hyperdynamic  post-stress with an EF of 70 - 72 %.    Post-stress: The post-stress echo showed normal wall motion which was  hyperdynamic compared to baseline.    Stress ECG: ECG Conclusion:  No ischemic ST segment changes occurred  with stress in the first 4 minutes in recovery.  At 5-minute in recovery  slight nonspecific 0.5 mm ST segment changes noted as above. No chest  pain. Normal functional capacity for age and gender with estimated  workload 7.2 METS.    Stress ECG: No ischemic ST segment changes occurred during exercise and  the first 4 minutes in recovery.  At 5-minute in recovery 0.5 mm flat ST  depression noted in leads III, aVF, V4, V5 and V6.    Stress ECG: No arrhythmias noted in the first 2 minutes in recovery.    At 3 minutes in recovery frequent PVCs noted some PVCs in trigeminal  pattern on and off up to 15 minutes in recovery.  At 16 minutes in  recovery PVCs got resolved.  Patient denied any palpitations, dizziness,  near-syncope or syncope.  Patient has not taken bisoprolol this morning  because of upcoming stress test.  She will take bisoprolol when she gets  home after stress test in 20 minutes as per patient.    Post-stress Impression: The study is normal.    Post-stress Impression: This study shows a low prognostic risk.    No significant valvular disease noted.  EKG:  EKG is personally reviewed. 04/03/2023: Not ordered. 03/06/2022: Sinus rhythm. Rate 83 bpm. 09/12/22: Sinus rhythm.  Rate 90 bpm.  PVCs.   Recent Labs: 09/14/2022: ALT 14; BUN 19; Creatinine, Ser 1.02; Potassium 5.1; Sodium 142   Recent Lipid Panel    Component Value Date/Time   CHOL 246 (H) 09/14/2022 0917   TRIG 82 09/14/2022 0917   HDL 102 09/14/2022 0917   CHOLHDL 2.4 09/14/2022 0917   CHOLHDL 3 01/02/2022 0958   VLDL 22.2 01/02/2022 0958   LDLCALC 130 (H) 09/14/2022 0917    Physical Exam:    VS:  BP (!)  144/96 (BP Location: Right Arm, Patient Position: Sitting, Cuff Size: Large)   Pulse 100   Ht 5' 2.5" (1.588 m)   Wt 171 lb 3.2 oz (77.7 kg)   SpO2 98%  BMI 30.81 kg/m  , BMI Body mass index is 30.81 kg/m. GENERAL:  Well appearing HEENT: Pupils equal round and reactive, fundi not visualized, oral mucosa unremarkable NECK:  No jugular venous distention, waveform within normal limits, carotid upstroke brisk and symmetric, no bruits, no thyromegaly LUNGS:  Clear to auscultation bilaterally HEART:  RRR.  PMI not displaced or sustained, S1 and S2 within normal limits, no S3, no S4, no clicks, no rubs, no murmurs ABD:  Flat, positive bowel sounds normal in frequency in pitch, no bruits, no rebound, no guarding, no midline pulsatile mass, no hepatomegaly, no splenomegaly EXT:  2 plus pulses throughout, no edema, no cyanosis, no clubbing SKIN:  No rashes, no nodules NEURO:  Cranial nerves II through XII grossly intact, motor grossly intact throughout PSYCH:  Cognitively intact, oriented to person place and time   ASSESSMENT/PLAN:    # Hypertension Elevated blood pressure readings in the office, but home readings are generally within normal range. Patient reports feeling unsteady with increased medication dosage. -Continue current antihypertensive regimen. -Encourage home blood pressure monitoring and report any consistently high readings. -Increase exercise to at least 150 minutes  # Adverse reaction to Lexapro Patient reports feeling "out of body" and unsteady after starting Lexapro, leading to increased food intake and weight gain. Patient has since stopped taking Lexapro and reports feeling more stable. -Discontinue Lexapro. -Monitor mood and report any changes.  # Weight gain Patient reports weight gain, likely secondary to increased food intake while on Lexapro. Patient acknowledges increased intake of fried foods and carbohydrates. -Encourage return to healthier eating habits,  focusing on a Mediterranean diet and limiting fried foods and carbohydrates. -Encourage regular exercise, including walking or running as tolerated.  Follow-up in 6 months, or sooner if needed.        Screening for Secondary Hypertension:     Relevant Labs/Studies:    Latest Ref Rng & Units 09/14/2022    9:17 AM 07/03/2022   12:27 PM 05/28/2022   10:40 AM  Basic Labs  Sodium 134 - 144 mmol/L 142  140  143   Potassium 3.5 - 5.2 mmol/L 5.1  4.5  4.8   Creatinine 0.57 - 1.00 mg/dL 1.61  0.96  0.45     Disposition:    FU with Gorge Almanza C. Duke Salvia, MD, [facility] in 6 months.  Medication Adjustments/Labs and Tests Ordered: Current medicines are reviewed at length with the patient today.  Concerns regarding medicines are outlined above.   No orders of the defined types were placed in this encounter.  No orders of the defined types were placed in this encounter.  I,Mathew Stumpf,acting as a Neurosurgeon for Chilton Si, MD.,have documented all relevant documentation on the behalf of Chilton Si, MD,as directed by  Chilton Si, MD while in the presence of Chilton Si, MD.  I, Randolf Sansoucie C. Duke Salvia, MD have reviewed all documentation for this visit.  The documentation of the exam, diagnosis, procedures, and orders on 04/03/2023 are all accurate and complete.   Signed, Chilton Si, MD  04/03/2023 4:52 PM    [facility]

## 2023-04-03 NOTE — Patient Instructions (Signed)
Medication Instructions:  Your physician recommends that you continue on your current medications as directed. Please refer to the Current Medication list given to you today.  Labwork: NONE  Testing/Procedures: NONE  Follow-Up: Your physician wants you to follow-up in: 6 MONTHS  You will receive a reminder letter in the mail two months in advance. If you don't receive a letter, please call our office to schedule the follow-up appointment.   Any Other Special Instructions Will Be Listed Below (If Applicable). Exercise recommendations: The American Heart Association recommends 150 minutes of moderate intensity exercise weekly. Try 30 minutes of moderate intensity exercise 4-5 times per week. This could include walking, jogging, or swimming.  WATCH CARBOHYDRATE INTAKE

## 2023-07-08 ENCOUNTER — Telehealth (HOSPITAL_COMMUNITY): Payer: Self-pay

## 2023-07-08 NOTE — Telephone Encounter (Signed)
07/10/23 appt confirmed by pt

## 2023-07-10 ENCOUNTER — Ambulatory Visit (INDEPENDENT_AMBULATORY_CARE_PROVIDER_SITE_OTHER): Payer: 59 | Admitting: Psychiatry

## 2023-07-10 ENCOUNTER — Encounter (HOSPITAL_COMMUNITY): Payer: Self-pay | Admitting: Psychiatry

## 2023-07-10 DIAGNOSIS — Z600 Problems of adjustment to life-cycle transitions: Secondary | ICD-10-CM | POA: Diagnosis not present
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
## 2023-08-06 ENCOUNTER — Telehealth: Payer: Self-pay | Admitting: Family

## 2023-08-06 DIAGNOSIS — I10 Essential (primary) hypertension: Secondary | ICD-10-CM

## 2023-08-06 MED ORDER — HYDRALAZINE HCL 50 MG PO TABS: 50.0000 mg | ORAL_TABLET | Freq: Two times a day (BID) | ORAL | Status: DC

## 2023-08-06 NOTE — Telephone Encounter (Signed)
 Called pharmacy and updated rx instructions and updated med list on file.

## 2023-08-06 NOTE — Telephone Encounter (Signed)
 Pt c/o medication issue:  1. Name of Medication: hydrALAZINE (APRESOLINE) 50 MG tablet   2. How are you currently taking this medication (dosage and times per day)? 50 mg, 2 times daily   3. Are you having a reaction (difficulty breathing--STAT)? No  4. What is your medication issue? Pharmacy calling to get clarification on how pt is to take med, bid or tid. OV states it was increased to TID. Please call pharm to advise

## 2023-08-06 NOTE — Telephone Encounter (Signed)
 BID as she has been unable to keep up with TID dosing previously.  Alver Sorrow, NP

## 2023-09-10 ENCOUNTER — Ambulatory Visit (INDEPENDENT_AMBULATORY_CARE_PROVIDER_SITE_OTHER): Payer: 59 | Admitting: Psychiatry

## 2023-09-10 DIAGNOSIS — Z600 Problems of adjustment to life-cycle transitions: Secondary | ICD-10-CM

## 2023-09-10 NOTE — Progress Notes (Signed)
 Virtual Visit via Video Note  I connected with Kristen Santiago on 09/10/23 at 10:10 AM EDT  by a video enabled telemedicine application and verified that I am speaking with the correct person using two identifiers.  Location: Patient: Classroom Provider: [facility] [place] office    I discussed the limitations of evaluation and management by telemedicine and the availability of in person appointments. The patient expressed understanding and agreed to proceed.   I provided 34 minutes of non-face-to-face time during this encounter.   Adah Salvage, LCSW    THERAPIST PROGRESS NOTE  Session Time: Tuesday 09/10/2023 10:10 AM - 10:44 AM   Participation Level: Active  Behavioral Response: CasualAlertAnxious  Type of Therapy: Individual Therapy  Treatment Goals addressed: Establish therapeutic alliance, learn and implement relaxation techniques  ProgressTowards Goals: Formal treatment plan will be developed next session  Interventions: CBT and Supportive  Summary: Kristen Santiago is a 58 y.o. female who is referred for services PCP. Pt reports no psychiatric hospitalizations. Pt participated in outpatient in therapy in [place] many years ago while going through a divorce. She also participated in therapy with a therapist in [place] during the pandemic.  Patient states moving here from [place] in 2021 to help her elderly parents.  Per her report it is stressful working with them as they are in their 38s and her father has mild dementia.  She also reports issues with her blood pressure also causes anxiety.  Per her report, it is not well-controlled and she has difficulty with side effects from some of the medication.  She also reports anxiety regarding change in medical providers when moving from [place].  Patient reports sometimes experiencing hopelessness, worrying about when it will be normal again, and experiencing loneliness.  Current symptoms include irritability,  thoughts/feelings of worthlessness, worry, and panic attacks.  Pt last was seen about 2 months ago for the assessment appt.  She reports  continued symptoms of anxiety along with ongoing stress. She reports worry about a variety of issues including her health, caretaker responsibilities for her elderly parents, her career change and reduction in income, and loneliness. She also expresses frustration as relationship with her sister has changed and sister does not help out with parents in the manner pt thought she would.    Suicidal/Homicidal: Nowithout intent/plan  Therapist Response: Reviewed symptoms, gathered more information from pt, discussed stressors, facilitated expression of thoughts and feelings, validated feelings, discussed rationale for and provided pt with instructions on how to use beach visualization for relaxation technique, checked out interactive audio activity to pt, will send pt via mail with access code, also developed plan with pt to complete therapy goals handout in preparation for next session.   Plan: Return again in 2 weeks.  Diagnosis: Phase of life problem in adult  Collaboration of Care: Other none done at this session  Patient/Guardian was advised Release of Information must be obtained prior to any record release in order to collaborate their care with an outside provider. Patient/Guardian was advised if they have not already done so to contact the registration department to sign all necessary forms in order for [place] to release information regarding their care.   Consent: Patient/Guardian gives verbal consent for treatment and assignment of benefits for services provided during this visit. Patient/Guardian expressed understanding and agreed to proceed.   Adah Salvage, LCSW 09/10/2023

## 2023-09-17 ENCOUNTER — Encounter (HOSPITAL_BASED_OUTPATIENT_CLINIC_OR_DEPARTMENT_OTHER): Payer: Self-pay | Admitting: Cardiovascular Disease

## 2023-09-19 ENCOUNTER — Encounter: Payer: Self-pay | Admitting: Otolaryngology

## 2023-09-19 DIAGNOSIS — J321 Chronic frontal sinusitis: Secondary | ICD-10-CM

## 2023-09-24 ENCOUNTER — Ambulatory Visit (INDEPENDENT_AMBULATORY_CARE_PROVIDER_SITE_OTHER): Payer: 59 | Admitting: Psychiatry

## 2023-09-24 ENCOUNTER — Encounter (HOSPITAL_COMMUNITY): Payer: Self-pay

## 2023-09-24 DIAGNOSIS — Z600 Problems of adjustment to life-cycle transitions: Secondary | ICD-10-CM | POA: Diagnosis not present

## 2023-09-24 NOTE — Progress Notes (Signed)
 Virtual Visit via Video Note  I connected with Kristen Santiago on 09/24/23 at 10:00 AM EDT  by a video enabled telemedicine application and verified that I am speaking with the correct person using two identifiers.  Location: Patient: Classroom Provider: [facility] [place] office    I discussed the limitations of evaluation and management by telemedicine and the availability of in person appointments. The patient expressed understanding and agreed to proceed.   I provided 57 minutes of non-face-to-face time during this encounter.   Dicie Foster, LCSW    THERAPIST PROGRESS NOTE  Session Time: Tuesday 09/24/2023 10:00 AM 10:57 AM   Participation Level: Active  Behavioral Response: CasualAlertAnxious  Type of Therapy: Individual Therapy  Treatment Goals addressed:  Pt will experience decreased worry AEB pt experiencing 1 or less episodes of worry per week consistently for 30 days per pt's report  Learn and implement 3 relaxation techniques, practice a relaxation techniques   ProgressTowards Goals: Initial   Interventions: CBT and Supportive  Summary: [name] is a 58 y.o. female who is referred for services PCP. Pt reports no psychiatric hospitalizations. Pt participated in outpatient in therapy in [place] many years ago while going through a divorce. She also participated in therapy with a therapist in [place] during the pandemic.  Patient states moving here from [place] in 2021 to help her elderly parents.  Per her report it is stressful working with them as they are in their 35s and her father has mild dementia.  She also reports issues with her blood pressure also causes anxiety.  Per her report, it is not well-controlled and she has difficulty with side effects from some of the medication.  She also reports anxiety regarding change in medical providers when moving from [place].  Patient reports sometimes experiencing hopelessness, worrying about when it  will be normal again, and experiencing loneliness.  Current symptoms include irritability, thoughts/feelings of worthlessness, worry, and panic attacks.  Pt last was seen about 2 weeks ago.  She reports continued stress and anxiety since last session.  However, she has been using coping strategies including deep breathing and beach visualization to help manage.  Per her report, these have been helpful.  She continues to express frustration regarding caretaker responsibilities for her elderly parents.  She also is experiencing grief and loss issues related to their changed functioning.  She expresses sadness as parents cannot do things they used to do.  She also expresses her as she reports mother is negative and critical as patient tries to help.  Patient states wanting to have peace, be able to smile again, and to have decreased worry.    Suicidal/Homicidal: Nowithout intent/plan  Therapist Response: Reviewed symptoms, administered GAD-7, discussed results, discussed stressors, facilitated expression of thoughts and feelings, validated feelings, praised and reinforced patient's use of deep breathing and beach visualization, discussed effects, developed treatment plan, sent patient signature page and treatment plan via MyChart, discussed rationale for and assisted patient practice progressive muscle relaxation as another technique to cope with stress and anxiety, checked out interactive audio activity to patient and provided with access code, developed plan with patient to practice a relaxation technique daily, oriented patient to CBT, discussed rationale for and developed plan with patient to keep anxiety log.  Therapist and patient also began to discuss possible referral for medication evaluation, will discuss more next session.  Plan: Return again in 2 weeks.  Diagnosis: Phase of life problem in adult  Collaboration of Care: Other none done at  this session  Patient/Guardian was advised Release of  Information must be obtained prior to any record release in order to collaborate their care with an outside provider. Patient/Guardian was advised if they have not already done so to contact the registration department to sign all necessary forms in order for us  to release information regarding their care.   Consent: Patient/Guardian gives verbal consent for treatment and assignment of benefits for services provided during this visit. Patient/Guardian expressed understanding and agreed to proceed.   Dicie Foster, LCSW 09/24/2023

## 2023-10-08 ENCOUNTER — Ambulatory Visit (INDEPENDENT_AMBULATORY_CARE_PROVIDER_SITE_OTHER): Payer: 59 | Admitting: Psychiatry

## 2023-10-08 DIAGNOSIS — Z600 Problems of adjustment to life-cycle transitions: Secondary | ICD-10-CM

## 2023-10-08 NOTE — Progress Notes (Signed)
 Therapist and pt began appt but were unable to complete due to pt having a schedule conflict. Therapist and pt agreed to cancel today's appt. Pt agrees to attend upcoming appt in 2 weeks.

## 2023-10-22 ENCOUNTER — Ambulatory Visit (INDEPENDENT_AMBULATORY_CARE_PROVIDER_SITE_OTHER): Payer: 59 | Admitting: Psychiatry

## 2023-10-22 DIAGNOSIS — Z600 Problems of adjustment to life-cycle transitions: Secondary | ICD-10-CM

## 2023-10-22 DIAGNOSIS — F419 Anxiety disorder, unspecified: Secondary | ICD-10-CM | POA: Diagnosis not present

## 2023-10-22 NOTE — Progress Notes (Signed)
 kVirtual Visit via Video Note  I connected with Kristen Santiago on 10/22/23 at 10:00 AM EDT  by a video enabled telemedicine application and verified that I am speaking with the correct person using two identifiers.  Location: Patient: Home Provider: Home Office   I discussed the limitations of evaluation and management by telemedicine and the availability of in person appointments. The patient expressed understanding and agreed to proceed.   I provided 65 minutes of non-face-to-face time during this encounter.   Dicie Foster, LCSW    THERAPIST PROGRESS NOTE  Session Time: Tuesday 5/132025 10:03 AM -11:08AM   Participation Level: Active  Behavioral Response: CasualAlertAnxious/tearful  Type of Therapy: Individual Therapy  Treatment Goals addressed:  Pt will experience decreased worry AEB pt experiencing 1 or less episodes of worry per week consistently for 30 days per pt's report  Learn and implement 3 relaxation techniques, practice a relaxation techniques   ProgressTowards Goals: Not progressing   Interventions: CBT and Supportive  Summary: [name] is a 58 y.o. female who is referred for services PCP. Pt reports no psychiatric hospitalizations. Pt participated in outpatient in therapy in [place] many years ago while going through a divorce. She also participated in therapy with a therapist in [place] during the pandemic.  Patient states moving here from [place] in 2021 to help her elderly parents.  Per her report it is stressful working with them as they are in their 50s and her father has mild dementia.  She also reports issues with her blood pressure also causes anxiety.  Per her report, it is not well-controlled and she has difficulty with side effects from some of the medication.  She also reports anxiety regarding change in medical providers when moving from [place].  Patient reports sometimes experiencing hopelessness, worrying about when it will be  normal again, and experiencing loneliness.  Current symptoms include irritability, thoughts/feelings of worthlessness, worry, and panic attacks.  Pt last was seen about 4 weeks ago.  She reports continued stress and anxiety since last session.  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  Patient also reports extra duties regarding her volunteer service at church.  She states feeling overwhelmed.  She continues to report transition issues related to her move from [place] 3 to 4 years ago.  She expresses frustration as her life is not what she anticipated it would be.  Patient reports her biggest stressor right now is her health as her blood pressure still remains uncontrolled.  She is experiencing headaches.  She has contacted her cardiologist and is scheduled to be seen in June.  She expresses ambivalent feelings about possible medication for anxiety as she fears side effects due to previous experience with psychotropics as well as experience with blood pressure medication.    Suicidal/Homicidal: Nowithout intent/plan  Therapist Response: Reviewed symptoms, discussed stressors, facilitated expression of thoughts and feelings, assisted patient identify realistic expectations of self regarding her responsibilities, assisted patient identify basic personal rights to promote more effective assertion, praised and reinforced patient's efforts to schedule an appointment with cardiologist, discussed ways to increase sources of energy and positive support for patient, developed plan with patient to follow-up on her plan to go to a nearby gym and check on possible services, discussed rationale for and assisted patient practice leaves on a stream mindfulness activity to cope with ruminating thoughts, develop plan with patient to practice between sessions, checked out interactive audio activity to  patient and provided with  access code to assist her in her efforts, facilitated patient expressing thoughts and concerns about medication, discussed referral to NP Shuvon Rankin for medication evaluation.   Plan: Return again in 2 weeks.  Diagnosis: Phase of life problem in adult  Collaboration of Care: Medication evaluation AEB therapist will refer patient to NP Shuvon Rankin for medication evaluation.  Patient/Guardian was advised Release of Information must be obtained prior to any record release in order to collaborate their care with an outside provider. Patient/Guardian was advised if they have not already done so to contact the registration department to sign all necessary forms in order for us  to release information regarding their care.   Consent: Patient/Guardian gives verbal consent for treatment and assignment of benefits for services provided during this visit. Patient/Guardian expressed understanding and agreed to proceed.   Dicie Foster, LCSW 10/22/2023

## 2023-11-21 ENCOUNTER — Ambulatory Visit (HOSPITAL_BASED_OUTPATIENT_CLINIC_OR_DEPARTMENT_OTHER): Payer: Self-pay | Admitting: Family

## 2023-11-21 ENCOUNTER — Encounter (HOSPITAL_BASED_OUTPATIENT_CLINIC_OR_DEPARTMENT_OTHER): Payer: Self-pay | Admitting: Family

## 2023-11-21 VITALS — BP 132/78 | HR 63 | Ht 62.5 in | Wt 159.3 lb

## 2023-11-21 DIAGNOSIS — R42 Dizziness and giddiness: Secondary | ICD-10-CM | POA: Diagnosis not present

## 2023-11-21 DIAGNOSIS — I10 Essential (primary) hypertension: Secondary | ICD-10-CM

## 2023-11-21 DIAGNOSIS — I493 Ventricular premature depolarization: Secondary | ICD-10-CM | POA: Diagnosis not present

## 2023-11-21 NOTE — Progress Notes (Signed)
 Advanced Hypertension Clinic Assessment:    Date:  11/21/2023   ID:  Kristen Santiago, DOB 1965/12/22, MRN [id number]  PCP:  Elester Grim, MD  Cardiologist:  Maudine Sos, MD  Nephrologist:  Referring MD: Elester Grim, MD   CC: Hypertension  History of Present Illness:    Kristen Santiago is a 58 y.o. female with a hx of HTN, HLD, PVC, fatty liver disease, anxiety here to follow up in the Advanced Hypertension Clinic.   Coronary calcium  score 04/2022 with no coronary calcification. Established with [facility] Clinic 03/06/22. Multiple medication intolerances detailed below. She has shown white coat hypertension superimposed on essential hypertension.   Seen 09/12/22. Due to significant stuggles with anxiety Lexapro  10mg  QD was initiated. BP was better controlled at home than in clinic with whitecoat hypertension superimposed on essential hypertension. Hydralazine  and Furosemide  were continued. Renal denervation was briefly discussed.   At visit 11/2022 she had stopped Lasix  as she reported side effects including headache.   Seen 03/2023 with elevated BP in clinic but controlled at home. She felt out of body after starting Lexapro  and had self discontinued, recommended to remain off. She was encouraged to return to healthier habits.   She sent a MyChart in April requesting a simple diuretic and reported feeling dizzy when taking Hydralazine  50mg  TID. Her prior trialed diuretics were reviewed and she was recommended to schedule overdue Advanced Hypertension Clinic follow up. Of note, had been recommended previously to reduce to BID dosing but had increased to TID at direction of her PCP.   Presents today for follow up. Since last seen, PCP stopped Hydralazine  and started Atenolol-Chlorthalidone . BP at home this morning 130. Weight loss of 12 pounds since clinic visit 9 months ago. 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 Excited for upcoming  cruise. She had PCP visit 2 days ago with BP 130/74. BP at home has been overall controlled. Excited to teach at [employer] next year.   Reports no shortness of breath nor dyspnea on exertion. Reports no chest pain, pressure, or tightness. No edema, orthopnea, PND. Reports no palpitations.    Previous antihypertensives: Lisinopril -HCTZ - indigestion Spironolactone - no clear adverse effects Amlodipine  Bisoprolol HCTZ Metoprolol Losartan  - switched to Olmesartan  Olmesartan  - lip tinging Chlorthalidone - hypercalcemia Furosemide  - headache Hydralazine  - dizziness with TID dosing, fluctuating BP with BID dosing Valsartan - headache  Past Medical History:  Diagnosis Date   Anxiety    GERD (gastroesophageal reflux disease)    Weight increased 01/12/2021    Past Surgical History:  Procedure Laterality Date   ABDOMINAL HYSTERECTOMY     ANKLE SURGERY     TONSILLECTOMY      Current Medications: Current Meds  Medication Sig   atenolol-chlorthalidone  (TENORETIC) 50-25 MG tablet Take 1 tablet by mouth daily.     Allergies:   Amlodipine , Bisoprolol, Escitalopram , Floxacillin [floxacillin (flucloxacillin)], Hydrochlorothiazide, Losartan , and Olmesartan    Social History   Socioeconomic History   Marital status: Single    Spouse name: Not on file   Number of children: Not on file   Years of education: Not on file   Highest education level: Not on file  Occupational History   Not on file  Tobacco Use   Smoking status: Never   Smokeless tobacco: Never  Vaping Use   Vaping status: Never Used  Substance and Sexual Activity   Alcohol use: Not Currently   Drug use: Never   Sexual activity: Never    Birth control/protection:  None  Other Topics Concern   Not on file  Social History Narrative   Not on file   Social Drivers of Health   Financial Resource Strain: Low Risk  (03/06/2022)   Overall Financial Resource Strain (CARDIA)    Difficulty of Paying Living Expenses: Not hard at all   Food Insecurity: No Food Insecurity (10/06/2022)   Hunger Vital Sign    Worried About Running Out of Food in the Last Year: Never true    Ran Out of Food in the Last Year: Never true  Transportation Needs: No Transportation Needs (10/06/2022)   PRAPARE - Administrator, Civil Service (Medical): No    Lack of Transportation (Non-Medical): No  Physical Activity: Insufficiently Active (03/06/2022)   Exercise Vital Sign    Days of Exercise per Week: 2 days    Minutes of Exercise per Session: 30 min  Stress: Not on file  Social Connections: Unknown (10/10/2021)   Received from [facility]   Social Network    Social Network: Not on file     Family History: The patient's family history includes Anxiety disorder in her father; Hypertension in her brother, father, mother, niece, and sister.  ROS:   Please see the history of present illness.    All other systems reviewed and are negative.  EKGs/Labs/Other Studies Reviewed:    EKG:  EKG is not ordered today.    Recent Labs: No results found for requested labs within last 365 days.   Recent Lipid Panel    Component Value Date/Time   CHOL 246 (H) 09/14/2022 0917   TRIG 82 09/14/2022 0917   HDL 102 09/14/2022 0917   CHOLHDL 2.4 09/14/2022 0917   CHOLHDL 3 01/02/2022 0958   VLDL 22.2 01/02/2022 0958   LDLCALC 130 (H) 09/14/2022 0917    Physical Exam:   VS:  BP 132/78   Pulse 63   Ht 5' 2.5 (1.588 m)   Wt 159 lb 4.8 oz (72.3 kg)   SpO2 96%   BMI 28.67 kg/m  , BMI Body mass index is 28.67 kg/m.  Vitals:   11/21/23 0758 11/21/23 0817  BP: (!) 160/100 132/78  Pulse: 63   Height: 5' 2.5 (1.588 m)   Weight: 159 lb 4.8 oz (72.3 kg)   SpO2: 96%   BMI (Calculated): 28.65     GENERAL:  Well appearing HEENT: Pupils equal round and reactive, fundi not visualized, oral mucosa unremarkable NECK:  No jugular venous distention, waveform within normal limits, carotid upstroke brisk and symmetric, no bruits, no  thyromegaly LYMPHATICS:  No cervical adenopathy LUNGS:  Clear to auscultation bilaterally HEART:  RRR.  PMI not displaced or sustained,S1 and S2 within normal limits, no S3, no S4, no clicks, no rubs, no murmurs ABD:  Flat, positive bowel sounds normal in frequency in pitch, no bruits, no rebound, no guarding, no midline pulsatile mass, no hepatomegaly, no splenomegaly EXT:  2 plus pulses throughout, no edema, no cyanosis no clubbing SKIN:  No rashes no nodules NEURO:  Cranial nerves II through XII grossly intact, motor grossly intact throughout PSYCH:  Cognitively intact, oriented to person place and time  ASSESSMENT/PLAN:    HTN -  Multiple prior intolerances. Reports BP at home has been well controlled. Interested in being off blood pressure medications. Discussed if SBP at home persistently <125 could consider reducing regimen. Stressors likely contributory. Has been working on lifestyle changes, encouraged to continue. Continue present regimen atenolol-chlorthalidone  50-25mg  daily.   HLD -  LDL goal <100. 2023 CTA with coronary calcium  score of 0. No recurrent chest pain.  Lipid managed through diet/exercise and monitored by PCP.   Obesity - Weight loss via diet and exercise encouraged. Congratulated on 121 pound weight loss over the last 9 months. Discussed the impact being overweight would have on cardiovascular risk.    Anxiety - Previously did not tolerate Lexapro . Management per PCP.   PVC - Quiescent. None noted on EKG today.   Vertigo - discussed possible vestibular therapy, will place referral.   Screening for Secondary Hypertension:     Relevant Labs/Studies:    Latest Ref Rng & Units 09/14/2022    9:17 AM 07/03/2022   12:27 PM 05/28/2022   10:40 AM  Basic Labs  Sodium 134 - 144 mmol/L 142  140  143   Potassium 3.5 - 5.2 mmol/L 5.1  4.5  4.8   Creatinine 0.57 - 1.00 mg/dL 1.19  1.47  8.29                      Disposition:    FU with MD/APP/PharmD in 6-9 months     Medication Adjustments/Labs and Tests Ordered: Current medicines are reviewed at length with the patient today.  Concerns regarding medicines are outlined above.  Orders Placed This Encounter  Procedures   Ambulatory referral to Physical Therapy   EKG 12-Lead   No orders of the defined types were placed in this encounter.   Signed, Clearnce Curia, NP  11/21/2023 12:35 PM    [facility]

## 2023-11-21 NOTE — Patient Instructions (Addendum)
 Medication Instructions:  Continue your current medications   Testing/Procedures: Your EKG today looks great!   Follow-Up: Please follow up in 6-9 months in [facility]N CLINIC with Dr. Theodis Fiscal, Neomi Banks, NP or Donivan Furry PharmD    Special Instructions:    [facility] [address] [place], [place] [address]  Call Us  Call: [phone]  Fax: [phone] Our Hours Monday-Fridayday 7:00 am - 5:00 pm  Per [facility]: Our Fit3D 3D Body Scanner also allows patients to capture and track valuable data on their way to reaching their wellness and body shape goals. The scanner measures body circumference, body composition, posture, weight, balance, and distribution of fat and muscle so that patients can target exercise strategies toward stubborn areas. The cost of a scan is $25 and you don't need to be a patient at the [facility] center to get a scan. Just call the office at [phone] to get scheduled.

## 2023-12-02 ENCOUNTER — Ambulatory Visit (INDEPENDENT_AMBULATORY_CARE_PROVIDER_SITE_OTHER): Admitting: Psychiatry

## 2023-12-02 DIAGNOSIS — F419 Anxiety disorder, unspecified: Secondary | ICD-10-CM | POA: Diagnosis not present

## 2023-12-02 DIAGNOSIS — Z600 Problems of adjustment to life-cycle transitions: Secondary | ICD-10-CM

## 2023-12-02 NOTE — Progress Notes (Signed)
 kVirtual Visit via Video Note  I connected with Kristen Santiago on 12/02/23 at 1:02 PM EDT  by a video enabled telemedicine application and verified that I am speaking with the correct person using two identifiers.  Location: Patient: Home Provider: [facility] [place]   I discussed the limitations of evaluation and management by telemedicine and the availability of in person appointments. The patient expressed understanding and agreed to proceed.   I provided  50 minutes of non-face-to-face time during this encounter.   Kristen FORBES Rubinstein, LCSW    THERAPIST PROGRESS NOTE  Session Time: Monday 12/02/2023 1:02 PM - 1:52 PM   Participation Level: Active  Behavioral Response: CasualAlert/less anxious   Type of Therapy: Individual Therapy  Treatment Goals addressed:  Pt will experience decreased worry AEB pt experiencing 1 or less episodes of worry per week consistently for 30 days per pt's report  Learn and implement 3 relaxation techniques, practice a relaxation techniques   ProgressTowards Goals: progressing   Interventions: CBT and Supportive  Summary: Kristen Santiago is a 58 y.o. female who is referred for services PCP. Pt reports no psychiatric hospitalizations. Pt participated in outpatient in therapy in [place] many years ago while going through a divorce. She also participated in therapy with a therapist in [place] during the pandemic.  Patient states moving here from [place] in 2021 to help her elderly parents.  Per her report it is stressful working with them as they are in their 64s and her father has mild dementia.  She also reports issues with her blood pressure also causes anxiety.  Per her report, it is not well-controlled and she has difficulty with side effects from some of the medication.  She also reports anxiety regarding change in medical providers when moving from [place].  Patient reports sometimes experiencing hopelessness, worrying about  when it will be normal again, and experiencing loneliness.  Current symptoms include irritability, thoughts/feelings of worthlessness, worry, and panic attacks.  Pt last was seen about 4- 5 weeks ago.  She reports decreased stress and anxiety/worry since last session.  Patient has started taking a natural supplement called Harmonia and says this has helped keep her calm.  She still is reluctant to take psychotropic medication but still is considering a medication evaluation.  Therapist informed patient NP Luisa Corrente does not have availability for several months.  Therapist provided patient with contact information for [facility] as well as [facility] if patient wishes to pursue med evaluation from another provider.  Therapist and patient also discussed possibility of patient contacting her PCP.  Patient overall is pleased with the use of the natural supplement. Patient also reports  practicing the leaves on a stream exercise and says this has been very helpful in coping with worry thoughts.  She implemented plan of going to the [facility].  She walks on the treadmill as well as swims and is pleased with her recent weight loss.  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  Patient also reports nurturing her spirituality and says this has been helpful as well.  She reports decreased stress and anxiety regarding the relationship with her sister as states being able to compartmentalize her responsibilities in taking care of her parents and accept her sister's choices.  Pt still is working on trying to determine ways to achieve balance regarding her responsibilities particularly regarding church.  Patient is excited about  going on an upcoming cruise. 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  Diagnosis: Phase of life problem in adult  Anxiety disorder, unspecified type  Collaboration of Care: Medication evaluation AEB therapist will refer patient to NP Shuvon Rankin for medication evaluation.  Patient/Guardian was advised Release of Information must be obtained prior to any record release in order to collaborate their care with an outside provider. Patient/Guardian was advised if they have not already done so to contact the registration department to sign all necessary forms in order for us  to release information regarding their care.   Consent: Patient/Guardian gives verbal consent for treatment and assignment of benefits for services provided during this visit. Patient/Guardian expressed understanding and agreed to proceed.   Kristen FORBES Rubinstein, LCSW 12/02/2023

## 2023-12-17 ENCOUNTER — Ambulatory Visit (INDEPENDENT_AMBULATORY_CARE_PROVIDER_SITE_OTHER): Admitting: Psychiatry

## 2023-12-17 DIAGNOSIS — F419 Anxiety disorder, unspecified: Secondary | ICD-10-CM

## 2023-12-17 NOTE — Progress Notes (Signed)
 IN- PERSON  THERAPIST PROGRESS NOTE  Session Time: Tuesday  12/17/2023 4:06 PM  - 4:54 PM   Participation Level: Active      Behavioral Response: CasualAlert/less anxious   Type of Therapy: Individual Therapy  Treatment Goals addressed:  Pt will experience decreased worry AEB pt experiencing 1 or less episodes of worry per week consistently for 30 days per pt's report  Learn and implement 3 relaxation techniques, practice a relaxation techniques   ProgressTowards Goals: progressing   Interventions: CBT and Supportive  Summary: Kristen Santiago is a 58 y.o. female who is referred for services PCP. Pt reports no psychiatric hospitalizations. Pt participated in outpatient in therapy in [place] many years ago while going through a divorce. She also participated in therapy with a therapist in [place] during the pandemic.  Patient states moving here from [place] in 2021 to help her elderly parents.  Per her report it is stressful working with them as they are in their 70s and her father has mild dementia.  She also reports issues with her blood pressure also causes anxiety.  Per her report, it is not well-controlled and she has difficulty with side effects from some of the medication.  She also reports anxiety regarding change in medical providers when moving from [place].  Patient reports sometimes experiencing hopelessness, worrying about when it will be normal again, and experiencing loneliness.  Current symptoms include irritability, thoughts/feelings of worthlessness, worry, and panic attacks.  Pt last was seen about 3-4 weeks ago.  She reports feeling better since last session.  She continues to experience anxiety but is trying to use her relaxation techniques.  Per patient's report, she went on an 8-day cruise.  She reports having a panic attack on the way to the cruise as she had worries about her blood pressure.  She also reports being very tense the first couple of days on the  cruise but eventually was able to relax.  She reports trying to be more aware of her actions and trying to work on staying calm.  However, she reports still having difficulty as she still does not have balance among her activities and responsibilities.  She still reports sometimes having unrealistic expectations of self.  She expresses some anxiety about resuming attendance at church due to expectations from her pastor regarding her volunteer responsibilities.  She also is still trying to determine ways to have more balance regarding caretaker responsibilities for her parents as well as have more realistic expectations of self regarding what she can do for her parents  Suicidal/Homicidal: Nowithout intent/plan  Therapist Response: Reviewed symptoms, praised and reinforced patient's efforts to use relaxation techniques, encouraged continued use as well as the gym, discussed stressors, facilitated expression of thoughts and feelings, validated feelings, continue to assist patient identify realistic expectations of self, assisted patient identify ways to set and maintain limits regarding volunteer responsibilities at church  Plan: Return again in 2 weeks.  Diagnosis: Anxiety disorder, unspecified type  Collaboration of Care: Medication evaluation AEB therapist will refer patient to NP Shuvon Rankin for medication evaluation.  Patient/Guardian was advised Release of Information must be obtained prior to any record release in order to collaborate their care with an outside provider. Patient/Guardian was advised if they have not already done so to contact the registration department to sign all necessary forms in order for us  to release information regarding their care.   Consent: Patient/Guardian gives verbal consent for treatment and assignment of benefits for services provided during this visit.  Patient/Guardian expressed understanding and agreed to proceed.   Winton FORBES Rubinstein, LCSW 12/17/2023

## 2023-12-19 ENCOUNTER — Ambulatory Visit (HOSPITAL_COMMUNITY): Attending: Family

## 2023-12-19 ENCOUNTER — Other Ambulatory Visit: Payer: Self-pay

## 2023-12-19 DIAGNOSIS — R42 Dizziness and giddiness: Secondary | ICD-10-CM | POA: Insufficient documentation

## 2023-12-19 NOTE — Therapy (Signed)
 OUTPATIENT PHYSICAL THERAPY VESTIBULAR EVALUATION     Patient Name: Kristen Santiago MRN: [id number] DOB:12/22/1965, 58 y.o., female Today's Date: 12/19/2023  END OF SESSION:  PT End of Session - 12/19/23 1257     Visit Number 1    Number of Visits 1    Date for PT Re-Evaluation 01/09/24    Authorization Type [employer]/ [employer]    PT Start Time 1259    PT Stop Time 1340    PT Time Calculation (min) 41 min    Activity Tolerance Patient tolerated treatment well    Behavior During Therapy [facility] for tasks assessed/performed          Past Medical History:  Diagnosis Date   Anxiety    GERD (gastroesophageal reflux disease)    Weight increased 01/12/2021   Past Surgical History:  Procedure Laterality Date   ABDOMINAL HYSTERECTOMY     ANKLE SURGERY     TONSILLECTOMY     Patient Active Problem List   Diagnosis Date Noted   GERD (gastroesophageal reflux disease) 08/17/2021   Fatty liver 04/18/2021   Lesion of left native kidney 04/18/2021   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   Situational anxiety 01/12/2021   Low serum vitamin D 01/12/2021   Metabolic syndrome X 01/12/2021   Mixed dyslipidemia 01/12/2021   Obesity 01/12/2021   Atypical chest pain 10/06/2020   Chronic hypertension 03/25/2018   ETD (Eustachian tube dysfunction), left 05/09/2016   Depression with anxiety 02/07/2016    PCP: Vernon Velna SAUNDERS, MD REFERRING PROVIDER: Vannie Reche RAMAN, NP  REFERRING DIAG: R42 (ICD-10-CM) - Vertigo  THERAPY DIAG:  Dizziness and giddiness  ONSET DATE: 10/2023  Rationale for Evaluation and Treatment: Rehabilitation  SUBJECTIVE:   SUBJECTIVE STATEMENT: Had a cold then had left ear infection; started having some vertigo; saw Dr. Maggie ; then PCP; gave ear drops; started getting better and then stopped but was still having balance issues and feeling like on a boat.  Went on a cruise; no problems on the cruise with a motion sickness patch.  Beginning of June; had a blood  pressure medication change and medication gave her a headache so she self dc'ed and has been monitoring her medication.  Trying to manage without medication.  Last took it 12/07/23 and has not had any dizziness since then.  States she does get wobbly with sit to stand.  Pt accompanied by: self  PERTINENT HISTORY: anxiety  PAIN:  Are you having pain? No  PRECAUTIONS: None  RED FLAGS: None   WEIGHT BEARING RESTRICTIONS: No  FALLS: Has patient fallen in last 6 months? No  PLOF: Independent  PATIENT GOALS: to return to normal   OBJECTIVE:  Note: Objective measures were completed at Evaluation unless otherwise noted.  DIAGNOSTIC FINDINGS: none  COGNITION: Overall cognitive status: Within functional limits for tasks assessed   SENSATION: WFL  EDEMA:  None noted   POSTURE:  No Significant postural limitations  Cervical ROM:  grossly wnl throughout  Active AROM (deg) eval  Flexion   Extension   Right lateral flexion   Left lateral flexion   Right rotation   Left rotation   (Blank rows = not tested)  STRENGTH: grossly appears wfl  LOWER EXTREMITY MMT:   MMT Right eval Left eval  Hip flexion    Hip abduction    Hip adduction    Hip internal rotation    Hip external rotation    Knee flexion    Knee extension    Ankle  dorsiflexion    Ankle plantarflexion    Ankle inversion    Ankle eversion    (Blank rows = not tested)  BED MOBILITY:  Sit to supine Complete Independence Supine to sit Complete Independence  TRANSFERS: Assistive device utilized: None  Sit to stand: Complete Independence Stand to sit: Complete Independence Chair to chair: Complete Independence Floor: not tested   GAIT: Gait pattern: WFL Distance walked: 50 ft in clinic Assistive device utilized: None Level of assistance: Complete Independence and Modified independence Comments: no significant gait deviation noted   VESTIBULAR ASSESSMENT:  GENERAL OBSERVATION:  glasses   SYMPTOM BEHAVIOR:  Subjective history: see above  Non-Vestibular symptoms: headaches  Type of dizziness: Imbalance (Disequilibrium), Unsteady with head/body turns, and Funny feeling in the head  Frequency: not in a week or so  Duration: brief  Aggravating factors: Occurs when standing still  and sometimes standing up too fast  Relieving factors: head stationary  Progression of symptoms: better  OCULOMOTOR EXAM:  Ocular Alignment: normal  Ocular ROM: No Limitations  Spontaneous Nystagmus: absent  Gaze-Induced Nystagmus: absent  Smooth Pursuits: intact  Saccades: intact  Convergence/Divergence: 5 cm     VESTIBULAR - OCULAR REFLEX:   Slow VOR: Normal  VOR Cancellation: Normal  Head-Impulse Test: not tested  Dynamic Visual Acuity: not tested   POSITIONAL TESTING: Right Dix-Hallpike: no nystagmus Left Dix-Hallpike: no nystagmus  MOTION SENSITIVITY:  Motion Sensitivity Quotient Intensity: 0 = none, 1 = Lightheaded, 2 = Mild, 3 = Moderate, 4 = Severe, 5 = Vomiting  Intensity  1. Sitting to supine   2. Supine to L side   3. Supine to R side   4. Supine to sitting   5. L Hallpike-Dix   6. Up from L    7. R Hallpike-Dix   8. Up from R    9. Sitting, head tipped to L knee   10. Head up from L knee   11. Sitting, head tipped to R knee   12. Head up from R knee   13. Sitting head turns x5   14.Sitting head nods x5   15. In stance, 180 turn to L    16. In stance, 180 turn to R     OTHOSTATICS: not done                                                                                                                             TREATMENT DATE: 12/19/23 physical therapy evaluation and HEP instruction   Canalith Repositioning:   Gaze Adaptation:   Habituation:   Other: see HEP  PATIENT EDUCATION: Education details: Patient educated on exam findings, POC, scope of PT, HEP, and information on BPPV. Person educated: Patient Education method: Explanation,  Demonstration, and Handouts Education comprehension: verbalized understanding, returned demonstration, verbal cues required, and tactile cues required   HOME EXERCISE PROGRAM: Access Code: [email or web address] URL: [email or web address] Date: 12/19/2023 Prepared by: AP - Rehab  Exercises - Brandt-Daroff Vestibular  Exercise  - 1 x daily - 5 x weekly - 2 sets - 10 reps - Seated Gaze Stabilization with Head Rotation  - 2 x daily - 7 x weekly - 2 sets - 10 reps - Seated Gaze Stabilization with Head Nod  - 2 x daily - 7 x weekly - 2 sets - 10 reps - Seated VOR Cancellation  - 2 x daily - 7 x weekly - 2 sets - 10 reps - Standing Gaze Stabilization with Head Rotation  - 2 x daily - 7 x weekly - 2 sets - 10 reps - Standing Gaze Stabilization with Head Nod  - 2 x daily - 7 x weekly - 2 sets - 10 reps - Standing VOR Cancellation  - 2 x daily - 7 x weekly - 2 sets - 10 reps - Romberg Stance with Head Rotation  - 1 x daily - 5 x weekly - 2 sets - 10 reps - Romberg Stance with Head Nods  - 1 x daily - 5 x weekly - 2 sets - 10 reps - Pencil Pushups  - 2 x daily - 7 x weekly - 2 sets - 10 reps  Patient Education - BPPV GOALS: Goals reviewed with patient? No  SHORT TERM GOALS: Target date: 12/19/2023  patient will be independent with initial HEP  Baseline: Goal status: MET     ASSESSMENT:  CLINICAL IMPRESSION: Patient is a 58 y.o. female who was seen today for physical therapy evaluation and treatment for R42 (ICD-10-CM) - Vertigo. Patient demonstrates increased no vestibular symptoms with provocative testing today so instructed in HEP.  Patient will benefit from skilled physical therapy services to address these deficits to improve level of function with ADLs, functional mobility tasks, and reduce risk for falls.    OBJECTIVE IMPAIRMENTS: dizziness.   ACTIVITY LIMITATIONS: standing  PARTICIPATION LIMITATIONS: meal prep and teaching  REHAB POTENTIAL: Good  CLINICAL DECISION  MAKING: Evolving/moderate complexity  EVALUATION COMPLEXITY: Moderate   PLAN:  PT FREQUENCY: 1x/week  PT DURATION: 1 week  PLANNED INTERVENTIONS: 97164- PT Re-evaluation, 97110-Therapeutic exercises, 97530- Therapeutic activity, 97112- Neuromuscular re-education, 97535- Self Care, 02859- Manual therapy, [id number]- Gait training, [phone]- Orthotic Fit/training, [phone]- Canalith repositioning, [id number]- Aquatic Therapy, 97760- Splinting, [phone]- Wound care (first 20 sq cm), 97598- Wound care (each additional 20 sq cm)Patient/Family education, Balance training, Stair training, Taping, Dry Needling, Joint mobilization, Joint manipulation, Spinal manipulation, Spinal mobilization, Scar mobilization, and DME instructions.   PLAN FOR NEXT SESSION: 1 time visit only.     1:37 PM, 12/19/23 Syd Newsome Small Revonda Menter MPT [facility] [place] [phone]

## 2023-12-31 ENCOUNTER — Ambulatory Visit (INDEPENDENT_AMBULATORY_CARE_PROVIDER_SITE_OTHER): Admitting: Psychiatry

## 2023-12-31 DIAGNOSIS — F419 Anxiety disorder, unspecified: Secondary | ICD-10-CM

## 2023-12-31 NOTE — Progress Notes (Unsigned)
 IN- PERSON  THERAPIST PROGRESS NOTE  Session Time: Tuesday  12/31/2023 3:04 PM - 3:55 PM  Participation Level: Active      Behavioral Response: CasualAlert/less anxious   Type of Therapy: Individual Therapy  Treatment Goals addressed:  Pt will experience decreased worry AEB pt experiencing 1 or less episodes of worry per week consistently for 30 days per pt's report  Learn and implement 3 relaxation techniques, practice a relaxation techniques   ProgressTowards Goals: progressing   Interventions: CBT and Supportive  Summary: Kristen Santiago is a 58 y.o. female who is referred for services PCP. Pt reports no psychiatric hospitalizations. Pt participated in outpatient in therapy in [place] many years ago while going through a divorce. She also participated in therapy with a therapist in [place] during the pandemic.  Patient states moving here from [place] in 2021 to help her elderly parents.  Per her report it is stressful working with them as they are in their 31s and her father has mild dementia.  She also reports issues with her blood pressure also causes anxiety.  Per her report, it is not well-controlled and she has difficulty with side effects from some of the medication.  She also reports anxiety regarding change in medical providers when moving from [place].  Patient reports sometimes experiencing hopelessness, worrying about when it will be normal again, and experiencing loneliness.  Current symptoms include irritability, thoughts/feelings of worthlessness, worry, and panic attacks.  Pt last was seen about 3-4 weeks ago.  She reports continuing to feel better regarding trying to balance activities in her life since last session.  She is pleased with increasing use of her assertiveness skills and conversation with her pastor regarding her involvement in volunteer activities.  She is scheduled to attend a meeting today with him and others in the ministry to develop a better  schedule.  She remains excited about starting her part-time job at [employer].  However, patient reports continued stress and anxiety regarding her health.  She discloses today worries constantly about her blood pressure and checks her blood pressure numerous times per day.  Patient reports being very embarrassed about how often she does this but feels driven to do this.  She reports fearing she will have a stroke.  Patient reports this fear began a couple of years ago when she moved here from [place] and was having difficulty regulating her blood pressure. Suicidal/Homicidal: Nowithout intent/plan  Therapist Response: Reviewed symptoms, praised and reinforced patient's use of assertiveness skills, discussed effects on mood/thought/behavior, gather more information from patient regarding worries about her health, began to explore the possibility of OCD, began to discuss health anxiety, developed plan with patient to review handout on health anxiety in preparation for next session, also developed plan with patient to complete screener, sent patient handouts via email,   Plan: Return again in 2 weeks.  Diagnosis: Anxiety disorder, unspecified type  Collaboration of Care: Medication evaluation AEB therapist will refer patient to NP Shuvon Rankin for medication evaluation.  Patient/Guardian was advised Release of Information must be obtained prior to any record release in order to collaborate their care with an outside provider. Patient/Guardian was advised if they have not already done so to contact the registration department to sign all necessary forms in order for us  to release information regarding their care.   Consent: Patient/Guardian gives verbal consent for treatment and assignment of benefits for services provided during this visit. Patient/Guardian expressed understanding and agreed to proceed.   Egor Fullilove E  Dante, LCSW 12/31/2023

## 2024-01-07 ENCOUNTER — Encounter (HOSPITAL_COMMUNITY): Payer: Self-pay

## 2024-01-07 ENCOUNTER — Emergency Department (HOSPITAL_COMMUNITY)

## 2024-01-07 ENCOUNTER — Emergency Department (HOSPITAL_COMMUNITY)
Admission: EM | Admit: 2024-01-07 | Discharge: 2024-01-07 | Disposition: A | Discharge status: Home / Self Care | Attending: Emergency Medicine | Admitting: Emergency Medicine

## 2024-01-07 ENCOUNTER — Other Ambulatory Visit: Payer: Self-pay

## 2024-01-07 ENCOUNTER — Telehealth: Payer: Self-pay | Admitting: Internal Medicine

## 2024-01-07 DIAGNOSIS — I10 Essential (primary) hypertension: Secondary | ICD-10-CM | POA: Diagnosis not present

## 2024-01-07 DIAGNOSIS — R42 Dizziness and giddiness: Secondary | ICD-10-CM | POA: Insufficient documentation

## 2024-01-07 DIAGNOSIS — R519 Headache, unspecified: Secondary | ICD-10-CM | POA: Insufficient documentation

## 2024-01-07 DIAGNOSIS — Z79899 Other long term (current) drug therapy: Secondary | ICD-10-CM | POA: Diagnosis not present

## 2024-01-07 LAB — CBC WITH DIFFERENTIAL/PLATELET
Abs Immature Granulocytes: 0.01 K/uL (ref 0.00–0.07)
Basophils Absolute: 0 K/uL (ref 0.0–0.1)
Basophils Relative: 1 %
Eosinophils Absolute: 0.1 K/uL (ref 0.0–0.5)
Eosinophils Relative: 1 %
HCT: 45.8 % (ref 36.0–46.0)
Hemoglobin: 14.2 g/dL (ref 12.0–15.0)
Immature Granulocytes: 0 %
Lymphocytes Relative: 55 %
Lymphs Abs: 3.8 K/uL (ref 0.7–4.0)
MCH: 24.1 pg — ABNORMAL LOW (ref 26.0–34.0)
MCHC: 31 g/dL (ref 30.0–36.0)
MCV: 77.6 fL — ABNORMAL LOW (ref 80.0–100.0)
Monocytes Absolute: 0.5 K/uL (ref 0.1–1.0)
Monocytes Relative: 8 %
Neutro Abs: 2.4 K/uL (ref 1.7–7.7)
Neutrophils Relative %: 35 %
Platelets: 299 K/uL (ref 150–400)
RBC: 5.9 MIL/uL — ABNORMAL HIGH (ref 3.87–5.11)
RDW: 13.6 % (ref 11.5–15.5)
WBC: 6.8 K/uL (ref 4.0–10.5)
nRBC: 0 % (ref 0.0–0.2)

## 2024-01-07 LAB — COMPREHENSIVE METABOLIC PANEL WITH GFR
ALT: 15 U/L (ref 0–44)
AST: 18 U/L (ref 15–41)
Albumin: 4.4 g/dL (ref 3.5–5.0)
Alkaline Phosphatase: 67 U/L (ref 38–126)
Anion gap: 11 (ref 5–15)
BUN: 16 mg/dL (ref 6–20)
CO2: 22 mmol/L (ref 22–32)
Calcium: 10 mg/dL (ref 8.9–10.3)
Chloride: 104 mmol/L (ref 98–111)
Creatinine, Ser: 0.84 mg/dL (ref 0.44–1.00)
GFR, Estimated: >60 mL/min (ref >60)
Glucose, Bld: 79 mg/dL (ref 70–99)
Potassium: 3.8 mmol/L (ref 3.5–5.1)
Sodium: 137 mmol/L (ref 135–145)
Total Bilirubin: 0.8 mg/dL (ref 0.0–1.2)
Total Protein: 7.8 g/dL (ref 6.5–8.1)

## 2024-01-07 LAB — MAGNESIUM: Magnesium: 2.4 mg/dL (ref 1.7–2.4)

## 2024-01-07 MED ORDER — CETIRIZINE-PSEUDOEPHEDRINE ER 5-120 MG PO TB12
1.0000 | ORAL_TABLET | Freq: Every day | ORAL | 0 refills | Status: AC
Start: 2024-01-07 — End: 2024-01-17

## 2024-01-07 MED ORDER — PREDNISONE 20 MG PO TABS: 40.0000 mg | ORAL_TABLET | Freq: Every day | ORAL | 0 refills | Status: DC

## 2024-01-07 NOTE — Discharge Instructions (Signed)
 As discussed, today's evaluation has been generally reassuring.  There is some evidence that there is a fluid collection in your mastoid which is part of your skull adjacent to your ear.  This may be contributing to your lightheadedness and dizziness.  Please take all medication as prescribed and follow-up with your physician.

## 2024-01-07 NOTE — ED Triage Notes (Signed)
 Pt also reports feeling like her atenolol is causing her to experience Vertigo.

## 2024-01-07 NOTE — ED Triage Notes (Signed)
 Pt arrived via POV c/o dizziness and headache since May. Pt also reports left ear pain, possible infection, and BP medication changes.

## 2024-01-07 NOTE — Telephone Encounter (Signed)
 Copied from CRM [id number]. Topic: Appointments - Transfer of Care >> Jan 07, 2024  2:42 PM Elle L wrote: Pt is requesting to transfer FROM: NP Corean Comment at [facility] Pt is requesting to transfer TO: PA Charmaine Grooms at [facility] Reason for requested transfer: The patient would like a new Provider. It is the responsibility of the team the patient would like to transfer to PA Egypt Grooms to reach out to the patient if for any reason this transfer is not acceptable.

## 2024-01-07 NOTE — ED Provider Notes (Signed)
 [facility] EMERGENCY DEPARTMENT AT [facility] Provider Note   CSN: [id number] Arrival date & time: 01/07/24  1102     Patient presents with: Dizziness   Kristen Santiago is a 58 y.o. female.   HPI Patient presents with her niece who assists with the history.  Patient notes worsening headache, lightheadedness, dizziness. Symptoms have been present possibly for weeks worsening over the past few days.  During this period the patient has seen primary care, ENT, has been on atenolol due to hypertension, but stopped the medication about 3 weeks ago with presumption that it was contributing to her dizziness and unsteadiness.  No chest pain, nausea, vomiting, fever, vision changes, no extremity weakness.    Prior to Admission medications   Medication Sig Start Date End Date Taking? Authorizing Provider  cetirizine -pseudoephedrine  (ZYRTEC -D) 5-120 MG tablet Take 1 tablet by mouth daily for 10 days. 01/07/24 01/17/24 Yes Garrick Charleston, MD  predniSONE  (DELTASONE ) 20 MG tablet Take 2 tablets (40 mg total) by mouth daily with breakfast. For the next four days 01/07/24  Yes Garrick Charleston, MD  atenolol-chlorthalidone  (TENORETIC) 50-25 MG tablet Take 1 tablet by mouth daily.    [provider]  magnesium oxide (MAG-OX) 400 MG tablet Take 200 mg by mouth every evening. 09/05/21   [provider]    Allergies: Amlodipine , Bisoprolol, Escitalopram , Floxacillin [floxacillin (flucloxacillin)], Hydrochlorothiazide, Losartan , and Olmesartan     Review of Systems  Updated Vital Signs BP (!) 174/69 (BP Location: Right Arm)   Pulse 74   Temp 98 F (36.7 C) (Oral)   Resp (!) 22   Ht 5' 2.5 (1.588 m)   Wt 72.3 kg   SpO2 99%   BMI 28.69 kg/m   Physical Exam Vitals and nursing note reviewed.  Constitutional:      General: She is not in acute distress.    Appearance: She is well-developed.  HENT:     Head: Normocephalic and atraumatic.     Right Ear: Tympanic  membrane, ear canal and external ear normal.     Left Ear: Tympanic membrane, ear canal and external ear normal.  Eyes:     Conjunctiva/sclera: Conjunctivae normal.  Cardiovascular:     Rate and Rhythm: Normal rate and regular rhythm.  Pulmonary:     Effort: Pulmonary effort is normal. No respiratory distress.     Breath sounds: No stridor.  Abdominal:     General: There is no distension.  Skin:    General: Skin is warm and dry.  Neurological:     Mental Status: She is alert and oriented to person, place, and time.     Cranial Nerves: No cranial nerve deficit.  Psychiatric:        Mood and Affect: Mood normal.     (all labs ordered are listed, but only abnormal results are displayed) Labs Reviewed  CBC WITH DIFFERENTIAL/PLATELET - Abnormal; Notable for the following components:      Result Value   RBC 5.90 (*)    MCV 77.6 (*)    MCH 24.1 (*)    All other components within normal limits  COMPREHENSIVE METABOLIC PANEL WITH GFR  MAGNESIUM    EKG: EKG Interpretation Date/Time:  Tuesday January 07 2024 12:32:25 EDT Ventricular Rate:  80 PR Interval:  148 QRS Duration:  90 QT Interval:  406 QTC Calculation: 469 R Axis:   73  Text Interpretation: Sinus rhythm Right atrial enlargement Confirmed by Garrick Charleston [phone]) on 01/07/2024 12:34:27 PM  Radiology: CT Head  Wo Contrast Result Date: 01/07/2024 CLINICAL DATA:  Dizziness, new onset. Headaches since May. Left ear pain. EXAM: CT HEAD WITHOUT CONTRAST TECHNIQUE: Contiguous axial images were obtained from the base of the skull through the vertex without intravenous contrast. RADIATION DOSE REDUCTION: This exam was performed according to the departmental dose-optimization program which includes automated exposure control, adjustment of the mA and/or kV according to patient size and/or use of iterative reconstruction technique. COMPARISON:  None Available. FINDINGS: Brain: No acute intracranial hemorrhage. No CT evidence of acute  infarct. No edema, mass effect, or midline shift. The basilar cisterns are patent. Ventricles: The ventricles are normal. Vascular: No hyperdense vessel or unexpected calcification. Skull: No acute or aggressive finding. Orbits: Orbits are symmetric. Sinuses: The visualized paranasal sinuses are clear. Other: Small left mastoid effusion. 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 Adjacent areas of calcification possibly within the joint space. IMPRESSION: No CT evidence of acute intracranial abnormality. 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 Small left mastoid effusion. Electronically Signed   By: Donnice Mania M.D.   On: 01/07/2024 13:49   DG Chest 2 View Result Date: 01/07/2024 CLINICAL DATA:  R/O PNA.  Dizziness.  Headache. EXAM: CHEST - 2 VIEW COMPARISON:  02/06/2021. FINDINGS: Bilateral lung fields are clear. Bilateral costophrenic angles are clear. Normal cardio-mediastinal silhouette. No acute osseous abnormalities. The soft tissues are within normal limits. IMPRESSION: No active cardiopulmonary disease. Electronically Signed   By: Ree Molt M.D.   On: 01/07/2024 13:46     Procedures   Medications Ordered in the ED - No data to display                                  Medical Decision Making Patient presents with ongoing dizziness, lightheadedness.  Broad differential including cardiogenic, neurogenic, vasogenic etiology.  Patient cessation of atenolol without appreciable change in her condition, possibly with worsening suggests it was not medication related.  Some consideration of vestibulitis contributing to her symptoms. Cardiac 85 sinus normal pulse ox 100% room air normal  Amount and/or Complexity of Data Reviewed Independent Historian:     Details: Neice External Data Reviewed: notes.    Details: ENT  notes from [facility] reviewed Labs: ordered. Decision-making details documented in ED Course. Radiology: ordered and independent interpretation performed. Decision-making details documented in ED Course. ECG/medicine tests: ordered and independent interpretation performed. Decision-making details documented in ED Course.  Risk OTC drugs. Prescription drug management. Decision regarding hospitalization.   3:02 PM Patient in no distress, awake, alert.  Vital signs unremarkable.  Patient with CT with small left mastoid effusion which may be contributing to her sense of discomfort, dizziness.  She has remained hemodynamically unremarkable for hours of monitoring in the ED, has no distress, no focal neurodeficits, is appropriate for further evaluation, monitoring, management as an outpatient.     Final diagnoses:  Dizziness    ED Discharge Orders          Ordered    cetirizine -pseudoephedrine  (ZYRTEC -D) 5-120 MG tablet  Daily        01/07/24 1502    predniSONE  (DELTASONE ) 20 MG tablet  Daily with breakfast        01/07/24 1502               Garrick Charleston, MD 01/07/24 [phone]

## 2024-01-07 NOTE — ED Notes (Signed)
 Patient transported to CT

## 2024-01-20 ENCOUNTER — Telehealth (HOSPITAL_COMMUNITY): Payer: Self-pay | Admitting: Psychiatry

## 2024-01-20 ENCOUNTER — Ambulatory Visit (HOSPITAL_COMMUNITY): Admitting: Psychiatry

## 2024-01-20 NOTE — Telephone Encounter (Signed)
 Therapist attempted to contact patient via text through caregility platform for scheduled appointment, no response.  Therapist called patient, left message indicating attempt, and requesting patient call office.

## 2024-01-24 ENCOUNTER — Ambulatory Visit (INDEPENDENT_AMBULATORY_CARE_PROVIDER_SITE_OTHER): Admitting: Psychiatry

## 2024-01-24 DIAGNOSIS — F419 Anxiety disorder, unspecified: Secondary | ICD-10-CM | POA: Diagnosis not present

## 2024-01-24 NOTE — Progress Notes (Signed)
 Virtual Visit via Video Note  I connected with Kristen Santiago on 01/24/24 at 11:12 AM by a video enabled telemedicine application and verified that I am speaking with the correct person using two identifiers.  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 The patient expressed understanding and agreed to proceed.  I provided 57 minutes of non-face-to-face time during this encounter.   Winton FORBES Rubinstein, LCSW    THERAPIST PROGRESS NOTE  Session Time: Friday 01/24/2024 11:07 AM - 12:04 PM  Participation Level: Active      Behavioral Response: CasualAlert/less anxious   Type of Therapy: Individual Therapy  Treatment Goals addressed:  Pt will experience decreased worry AEB pt experiencing 1 or less episodes of worry per week consistently for 30 days per pt's report  Learn and implement 3 relaxation techniques, practice a relaxation techniques   ProgressTowards Goals: progressing   Interventions: CBT and Supportive  Summary: Kristen Santiago is a 58 y.o. female who is referred for services PCP. Pt reports no psychiatric hospitalizations. Pt participated in outpatient in therapy in [place] many years ago while going through a divorce. She also participated in therapy with a therapist in [place] during the pandemic.  Patient states moving here from [place] in 2021 to help her elderly parents.  Per her report it is stressful working with them as they are in their 49s and her father has mild dementia.  She also reports issues with her blood pressure also causes anxiety.  Per her report, it is not well-controlled and she has difficulty with side effects from some of the medication.  She also reports anxiety regarding change in medical providers when moving from [place].  Patient reports sometimes experiencing hopelessness, worrying about when it will be  normal again, and experiencing loneliness.  Current symptoms include irritability, thoughts/feelings of worthlessness, worry, and panic attacks.  Pt last was seen about 3-4 weeks ago.  She reports continued stress and anxiety since last session but managing better.  Per patient's report, she has become much more aware of issues related to health anxiety after reviewing information provided last session.  Patient reports significantly reducing checking her blood pressure.  She also reports she no longer is taking her blood pressure cuff in the car when she goes places.  She reports recognizing her anxiety regarding her health began when she moved to this area from [place].  She continues to express frustration regarding medical services but is hopeful about working with a different primary care physician.  Patient reports she has been using self-talk, distracting activities, and grounding techniques to try to avoid checking blood pressure excessively.  She is very pleased with her efforts.  Suicidal/Homicidal: Nowithout intent/plan  Therapist Response: Reviewed symptoms, praised and reinforced patient's reduced checking, assisted patient do behavioral analysis of her checking behavior, assisted patient identify triggers of anxiety, assisted patient identify bodily sensations and thoughts, discussed rationale for and assisted patient practice body scan meditation, developed plan with patient to practice regularly, checked out interactive audio activity and provided patient with access code,   Plan: Return again in 2 weeks.  Diagnosis: Anxiety disorder, unspecified  Collaboration of Care: Medication evaluation AEB therapist will refer patient to NP Shuvon Rankin for medication evaluation.  Patient/Guardian was advised Release of Information must be obtained prior to any record release in order to collaborate their care with an outside provider. Patient/Guardian was advised if they  have not already done so  to contact the registration department to sign all necessary forms in order for us  to release information regarding their care.   Consent: Patient/Guardian gives verbal consent for treatment and assignment of benefits for services provided during this visit. Patient/Guardian expressed understanding and agreed to proceed.   Winton FORBES Rubinstein, LCSW 01/24/2024

## 2024-02-14 ENCOUNTER — Ambulatory Visit: Payer: Self-pay | Admitting: *Deleted

## 2024-02-14 ENCOUNTER — Other Ambulatory Visit: Payer: Self-pay | Admitting: Physician Assistant

## 2024-02-14 DIAGNOSIS — Z1231 Encounter for screening mammogram for malignant neoplasm of breast: Secondary | ICD-10-CM

## 2024-02-14 NOTE — Telephone Encounter (Signed)
 FYI Only or Action Required?: FYI only for provider.  Patient was last seen in primary care on 01/02/2022 by Lucius Krabbe, NP.  Called Nurse Triage reporting Anxiety.  Symptoms began about a month ago.  Interventions attempted: Rest, hydration, or home remedies.  Symptoms are: gradually worsening.  Triage Disposition: See PCP When Office is Open (Within 3 Days)  Patient/caregiver understands and will follow disposition?: Patient awaiting NP appointment- advised care options- [facility]- patient denies needing emergency help. She is on wait list.    Reason for Disposition  [1] Anxiety symptoms AND [2] has not been evaluated for this by doctor (or NP/PA)  Answer Assessment - Initial Assessment Questions 1. CONCERN: Did anything happen that prompted you to call today?      Changing providers- day to day- trying holistic approach- wonders if she needs medication 2. ANXIETY SYMPTOMS: Can you describe how you (your loved one; patient) have been feeling? (e.g., tense, restless, panicky, anxious, keyed up, overwhelmed, sense of impending doom).      Nervous, overwhelmed  3. ONSET: How long have you been feeling this way? (e.g., hours, days, weeks)     1 month- started teaching, long drive to work 4. SEVERITY: How would you rate the level of anxiety? (e.g., 0 - 10; or mild, moderate, severe).     6/10 5. FUNCTIONAL IMPAIRMENT: How have these feelings affected your ability to do daily activities? Have you had more difficulty than usual doing your normal daily activities? (e.g., getting better, same, worse; self-care, school, work, interactions)     Patient is able to function 6. HISTORY: Have you felt this way before? Have you ever been diagnosed with an anxiety problem in the past? (e.g., generalized anxiety disorder, panic attacks, PTSD). If Yes, ask: How was this problem treated? (e.g., medicines, counseling, etc.)     Yes- 2023- patient used Lexapro -didn't continue 7. RISK  OF HARM - SUICIDAL IDEATION: Do you ever have thoughts of hurting or killing yourself? If Yes, ask:  Do you have these feelings now? Do you have a plan on how you would do this?     no 8. TREATMENT:  What has been done so far to treat this anxiety? (e.g., medicines, relaxation strategies). What has helped?     No medications 9. THERAPIST: Do you have a counselor or therapist? If Yes, ask: What is their name?     Patient states she has referral - but never heard back 10. POTENTIAL TRIGGERS: Do you drink caffeinated beverages (e.g., coffee, colas, teas), and how much daily? Do you drink alcohol or use any drugs? Have you started any new medicines recently?       no 11. PATIENT SUPPORT: Who is with you now? Who do you live with? Do you have family or friends who you can talk to?        Patient is using OTC: Gaba, vitamins, family,friend 12. OTHER SYMPTOMS: Do you have any other symptoms? (e.g., feeling depressed, trouble concentrating, trouble sleeping, trouble breathing, palpitations or fast heartbeat, chest pain, sweating, nausea, or diarrhea)       Mind racing  Protocols used: Anxiety and Panic Attack-A-AH   Copied from CRM #[id number]. Topic: Clinical - Red Word Triage >> Feb 14, 2024  8:55 AM Treva T wrote: [facility] that prompted transfer to Nurse Triage: Patient calling, reports increased anxiety, symptoms are nervousness, feelings of being overwhelmed. Especially in the mornings.

## 2024-02-18 ENCOUNTER — Ambulatory Visit (HOSPITAL_COMMUNITY): Admitting: Psychiatry

## 2024-02-18 DIAGNOSIS — F419 Anxiety disorder, unspecified: Secondary | ICD-10-CM

## 2024-02-18 NOTE — Progress Notes (Unsigned)
 Virtual Visit via Video Note  I connected with Kristen Santiago on 02/18/24 at 4:01 PM by a video enabled telemedicine application and verified that I am speaking with the correct person using two identifiers.  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 The patient expressed understanding and agreed to proceed.  I provided 39 minutes of non-face-to-face time during this encounter.   Kristen FORBES Rubinstein, LCSW    THERAPIST PROGRESS NOTE  Session Time: Tuesday 02/18/2024 4:01 PM -  4:40 PM   Participation Level: Active      Behavioral Response: CasualAlert/less anxious   Type of Therapy: Individual Therapy  Treatment Goals addressed:  Pt will experience decreased worry AEB pt experiencing 1 or less episodes of worry per week consistently for 30 days per pt's report  Learn and implement 3 relaxation techniques, practice a relaxation techniques   ProgressTowards Goals: progressing   Interventions: CBT and Supportive  Summary: Kristen Santiago is a 58 y.o. female who is referred for services PCP. Pt reports no psychiatric hospitalizations. Pt participated in outpatient in therapy in [place] many years ago while going through a divorce. She also participated in therapy with a therapist in [place] during the pandemic.  Patient states moving here from [place] in 2021 to help her elderly parents.  Per her report it is stressful working with them as they are in their 11s and her father has mild dementia.  She also reports issues with her blood pressure also causes anxiety.  Per her report, it is not well-controlled and she has difficulty with side effects from some of the medication.  She also reports anxiety regarding change in medical providers when moving from [place].  Patient reports sometimes experiencing hopelessness, worrying about when it will be  normal again, and experiencing loneliness.  Current symptoms include irritability, thoughts/feelings of worthlessness, worry, and panic attacks.  Pt last was seen about 3-4 weeks ago.  She reports increased stress and anxiety since last session.  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  She reports additional stress regarding caretaker responsibilities for her parents.  Patient reports feeling anxious and overwhelmed.  She also reports having frequent headaches.  She reports reducing some stress by discontinuing her volunteer position with her church and reports this has helped to some extent.  Patient reports she has been practicing deep breathing and  grounding techniques to try to cope with stress. She reports she has not been practicing the body scan meditation.  S Suicidal/Homicidal: Nowithout intent/plan  Therapist Response: Reviewed symptoms, discussed stressors, facilitated expression of thoughts and feelings, validated feelings, praised and reinforced patient's efforts to use coping techniques, discussed effects of use, assisted patient examine her pattern of interaction with her family and explored possibility of reducing frequency/length of visits to try to reduce patient's stress level and achieve more balance in patient's schedule, also explored other grounding activities and developed plan with patient to practice, also encouraged patient to use body scan meditation throughout the day Plan: Return again in 2 weeks.  Diagnosis: Anxiety disorder, unspecified  Collaboration of Care: None done at this session  Patient/Guardian was advised Release of Information must be obtained prior to any record release in order to collaborate their care with an outside provider. Patient/Guardian was advised if they have not already  done so to contact the registration department to sign all necessary forms  in order for us  to release information regarding their care.   Consent: Patient/Guardian gives verbal consent for treatment and assignment of benefits for services provided during this visit. Patient/Guardian expressed understanding and agreed to proceed.   Kristen FORBES Rubinstein, LCSW 02/18/2024

## 2024-03-09 ENCOUNTER — Ambulatory Visit (HOSPITAL_COMMUNITY): Admitting: Psychiatry

## 2024-03-12 ENCOUNTER — Ambulatory Visit (HOSPITAL_COMMUNITY): Admitting: Psychiatry

## 2024-03-26 ENCOUNTER — Ambulatory Visit (HOSPITAL_COMMUNITY): Admitting: Psychiatry

## 2024-03-27 ENCOUNTER — Ambulatory Visit
Admission: RE | Admit: 2024-03-27 | Discharge: 2024-03-27 | Disposition: A | Source: Ambulatory Visit | Attending: Physician Assistant

## 2024-03-27 DIAGNOSIS — Z1231 Encounter for screening mammogram for malignant neoplasm of breast: Secondary | ICD-10-CM

## 2024-03-31 ENCOUNTER — Ambulatory Visit: Admitting: Physician Assistant

## 2024-03-31 ENCOUNTER — Encounter: Payer: Self-pay | Admitting: Physician Assistant

## 2024-03-31 VITALS — BP 161/84 | HR 98 | Temp 97.2°F | Ht 61.25 in | Wt 157.8 lb

## 2024-03-31 DIAGNOSIS — F411 Generalized anxiety disorder: Secondary | ICD-10-CM

## 2024-03-31 DIAGNOSIS — Z7689 Persons encountering health services in other specified circumstances: Secondary | ICD-10-CM

## 2024-03-31 DIAGNOSIS — I10 Essential (primary) hypertension: Secondary | ICD-10-CM

## 2024-03-31 MED ORDER — ESCITALOPRAM OXALATE 5 MG PO TABS: 5.0000 mg | ORAL_TABLET | Freq: Every day | ORAL | 1 refills | Status: DC

## 2024-03-31 NOTE — Assessment & Plan Note (Signed)
 Anxiety with racing thoughts and uneasiness, possibly worsened by menopause and new job. Previous medications not well tolerated. Prefers non-pharmacological methods but open to medication. Lengthy discussion on treatment options today. Patient denies allergy to Lexapro , stating it made her feel funny but she is unable to describe why she felt this way. Discussed SNRIs in detail. Patient interested in retrying Lexapro .  - Prescribed Lexapro  5 mg daily for anxiety and menopausal symptoms. - Patient counseled on side effects such as nausea and headache, she was advised to stop taking medication if she experiences suicidal ideation.  - Re-evaluate anxiety symptoms in one month. - Encouraged follow-up with therapist Miss Winton.

## 2024-03-31 NOTE — Progress Notes (Signed)
 New Patient Office Visit  Subjective    Patient ID: Kristen Santiago, female    DOB: 08/12/1965  Age: 58 y.o. MRN: [id number]  CC:  Chief Complaint  Patient presents with   Headache    Pt. Was on Atenolol and stopped taking the medication in July and said her head still feels off and still has severe headaches.     HPI Kristen Santiago presents to establish care  Discussed the use of AI scribe software for clinical note transcription with the patient, who gave verbal consent to proceed.  History of Present Illness Kristen Santiago is a 58 year old female who presents for establishment of care.  Patient with past medical history significant for hypertension, anxiety, and vitamin D deficiency. She manages hypertension through diet and exercise, with home blood pressure readings of 132/76 when relaxed and up to 148-150 during the day. Patient with multiple reported allergies to diuretics, ACEi/ARBs, calcium  channel blockers, and beta blockers. She has been seen by Dr. Raford with cardiology in the past. She has recent stopped taking atenolol due to reports of severe headaches.   She experiences anxiety related to starting a new job and life changes, using meditation and natural remedies for management. Patient has previously followed with a therapist for management of anxiety. She states significant feelings of anxiety and feels like her mind is racing recently. She takes 50,000 units of vitamin D3 for vitamin D deficiency and supplements with magnesium, a multivitamin, and a cholesterol supplement. She has a history of TMJ and sinus issues treated with ibuprofen. She reports no new chest pain, shortness of breath, or visual changes. Headaches were related to previous medication use.   Outpatient Encounter Medications as of 03/31/2024  Medication Sig   escitalopram  (LEXAPRO ) 5 MG tablet Take 1 tablet (5 mg total) by mouth daily.   magnesium oxide (MAG-OX) 400 MG tablet Take 200 mg  by mouth every evening.   [DISCONTINUED] predniSONE  (DELTASONE ) 20 MG tablet Take 2 tablets (40 mg total) by mouth daily with breakfast. For the next four days   [DISCONTINUED] atenolol-chlorthalidone  (TENORETIC) 50-25 MG tablet Take 1 tablet by mouth daily. (Patient not taking: Reported on 03/31/2024)   No facility-administered encounter medications on file as of 03/31/2024.    Past Medical History:  Diagnosis Date   Anxiety    GERD (gastroesophageal reflux disease)    Hypertension    Weight increased 01/12/2021    Past Surgical History:  Procedure Laterality Date   ABDOMINAL HYSTERECTOMY     ANKLE SURGERY     TONSILLECTOMY      Family History  Problem Relation Age of Onset   Hypertension Mother        Controlled HP   Anxiety disorder Father    Hypertension Father        Controlled Hp with medication   Hypertension Sister    Hypertension Brother    Hypertension Niece    Breast cancer Neg Hx     Social History   Socioeconomic History   Marital status: Single    Spouse name: Not on file   Number of children: Not on file   Years of education: Not on file   Highest education level: Not on file  Occupational History   Not on file  Tobacco Use   Smoking status: Never    Passive exposure: Never   Smokeless tobacco: Never  Vaping Use   Vaping status: Never Used  Substance and Sexual Activity   Alcohol use:  Not Currently   Drug use: Never   Sexual activity: Never    Birth control/protection: None  Other Topics Concern   Not on file  Social History Narrative   Not on file   Social Drivers of Health   Financial Resource Strain: Low Risk  (03/06/2022)   Overall Financial Resource Strain (CARDIA)    Difficulty of Paying Living Expenses: Not hard at all  Food Insecurity: No Food Insecurity (10/06/2022)   Hunger Vital Sign    Worried About Running Out of Food in the Last Year: Never true    Ran Out of Food in the Last Year: Never true  Transportation Needs: No  Transportation Needs (10/06/2022)   PRAPARE - Administrator, Civil Service (Medical): No    Lack of Transportation (Non-Medical): No  Physical Activity: Insufficiently Active (03/06/2022)   Exercise Vital Sign    Days of Exercise per Week: 2 days    Minutes of Exercise per Session: 30 min  Stress: Not on file  Social Connections: Unknown (10/10/2021)   Received from [facility]   Social Network    Social Network: Not on file  Intimate Partner Violence: Unknown (10/06/2022)   Humiliation, Afraid, Rape, and Kick questionnaire    Fear of Current or Ex-Partner: No    Emotionally Abused: No    Physically Abused: No    Sexually Abused: Not on file    Review of Systems  Constitutional:  Negative for chills, fever and malaise/fatigue.  Eyes:  Negative for blurred vision and double vision.  Respiratory:  Negative for shortness of breath.   Cardiovascular:  Negative for chest pain and palpitations.  Neurological:  Positive for headaches. Negative for dizziness.  Psychiatric/Behavioral:  Negative for depression. The patient is nervous/anxious.       Objective    BP (!) 161/84   Pulse 98   Temp (!) 97.2 F (36.2 C)   Ht 5' 1.25 (1.556 m)   Wt 157 lb 12 oz (71.6 kg)   SpO2 100%   BMI 29.56 kg/m   Physical Exam Constitutional:      General: She is not in acute distress.    Appearance: Normal appearance. She is not ill-appearing.  HENT:     Head: Normocephalic and atraumatic.     Mouth/Throat:     Mouth: Mucous membranes are moist.     Pharynx: Oropharynx is clear.  Eyes:     Extraocular Movements: Extraocular movements intact.     Conjunctiva/sclera: Conjunctivae normal.  Cardiovascular:     Rate and Rhythm: Normal rate and regular rhythm.     Heart sounds: Normal heart sounds. No murmur heard. Pulmonary:     Effort: Pulmonary effort is normal.     Breath sounds: Normal breath sounds.  Musculoskeletal:     Right lower leg: No edema.     Left lower leg: No  edema.  Skin:    General: Skin is warm and dry.  Neurological:     General: No focal deficit present.     Mental Status: She is alert and oriented to person, place, and time.  Psychiatric:        Mood and Affect: Mood is anxious.        Behavior: Behavior normal.       Assessment & Plan:  Encounter to establish care  Primary hypertension Assessment & Plan: Hypertension with variable control. Home readings reported 132/76 to 148/80s; in-office 161/93. Managed with lifestyle changes. No current medication due to  adverse effects. - Provided blood pressure journal for home monitoring. - Re-evaluate blood pressure in one month. - Follow up sooner for new chest pain, shortness of breath, headache, or visual changes.     Generalized anxiety disorder Assessment & Plan: Anxiety with racing thoughts and uneasiness, possibly worsened by menopause and new job. Previous medications not well tolerated. Prefers non-pharmacological methods but open to medication. Lengthy discussion on treatment options today. Patient denies allergy to Lexapro , stating it made her feel funny but she is unable to describe why she felt this way. Discussed SNRIs in detail. Patient interested in retrying Lexapro .  - Prescribed Lexapro  5 mg daily for anxiety and menopausal symptoms. - Patient counseled on side effects such as nausea and headache, she was advised to stop taking medication if she experiences suicidal ideation.  - Re-evaluate anxiety symptoms in one month. - Encouraged follow-up with therapist Miss Winton.  Orders: -     Escitalopram  Oxalate; Take 1 tablet (5 mg total) by mouth daily.  Dispense: 30 tablet; Refill: 1   Return in about 4 weeks (around 04/28/2024).   Kristen Carliyah Cotterman, PA-C

## 2024-03-31 NOTE — Assessment & Plan Note (Signed)
 Hypertension with variable control. Home readings reported 132/76 to 148/80s; in-office 161/93. Managed with lifestyle changes. No current medication due to adverse effects. - Provided blood pressure journal for home monitoring. - Re-evaluate blood pressure in one month. - Follow up sooner for new chest pain, shortness of breath, headache, or visual changes.

## 2024-04-01 ENCOUNTER — Ambulatory Visit: Payer: Self-pay | Admitting: Physician Assistant

## 2024-04-09 ENCOUNTER — Ambulatory Visit (HOSPITAL_COMMUNITY): Admitting: Psychiatry

## 2024-04-28 ENCOUNTER — Ambulatory Visit: Admitting: Physician Assistant

## 2024-05-14 ENCOUNTER — Ambulatory Visit: Admitting: Physician Assistant

## 2024-05-19 ENCOUNTER — Other Ambulatory Visit: Payer: Self-pay | Admitting: Physician Assistant

## 2024-05-19 DIAGNOSIS — F411 Generalized anxiety disorder: Secondary | ICD-10-CM

## 2024-06-02 ENCOUNTER — Encounter: Payer: Self-pay | Admitting: Physician Assistant

## 2024-06-02 ENCOUNTER — Ambulatory Visit: Admitting: Physician Assistant

## 2024-06-02 VITALS — BP 142/80 | HR 98 | Temp 97.3°F | Ht 61.25 in | Wt 159.1 lb

## 2024-06-02 DIAGNOSIS — F411 Generalized anxiety disorder: Secondary | ICD-10-CM | POA: Diagnosis not present

## 2024-06-02 DIAGNOSIS — I10 Essential (primary) hypertension: Secondary | ICD-10-CM

## 2024-06-02 NOTE — Progress Notes (Signed)
 "  Established Patient Office Visit  Subjective   Patient ID: Kristen Santiago, female    DOB: June 10, 1966  Age: 58 y.o. MRN: [id number]  Chief Complaint  Patient presents with   Follow-up    Patient is here for a follow up visit.  Wanting recommendations on anxiety treatments naturally   Discussed the use of AI scribe software for clinical note transcription with the patient, who gave verbal consent to proceed.  History of Present Illness Kristen Santiago is a 58 year old female with anxiety and hypertension who presents for follow-up.  She trialed and stopped escitalopram  after 30 days because it made her feel unlike herself and prefers to manage anxiety with supplements. She uses magnesium in the evening, which she feels improves her anxiety, and is interested in trying a lavender oil supplement.  She notes better blood pressure control when she takes magnesium regularly. She currently takes magnesium oxide 200 mg in the evening. Atenolol was stopped in June due to severe headaches and patient has multiple antihypertensive allergies/intolerances. Home blood pressures average about 140/80 at home, and she expects higher readings in clinic due to anxiety. She has no chest pain, headaches, or vision changes.    Review of Systems  Constitutional:  Negative for activity change, appetite change, fatigue and fever.  Eyes:  Negative for visual disturbance.  Respiratory:  Negative for chest tightness and shortness of breath.   Cardiovascular:  Negative for chest pain.  Neurological:  Negative for light-headedness and headaches.  Psychiatric/Behavioral:  The patient is nervous/anxious (improving).        Objective:     BP (!) 142/80   Pulse 98   Temp (!) 97.3 F (36.3 C)   Ht 5' 1.25 (1.556 m)   Wt 159 lb 2 oz (72.2 kg)   SpO2 100%   BMI 29.82 kg/m    Physical Exam Constitutional:      General: She is not in acute distress.    Appearance: Normal appearance. She is not  ill-appearing.  HENT:     Head: Normocephalic and atraumatic.     Mouth/Throat:     Mouth: Mucous membranes are moist.     Pharynx: Oropharynx is clear.  Eyes:     Extraocular Movements: Extraocular movements intact.     Conjunctiva/sclera: Conjunctivae normal.  Cardiovascular:     Rate and Rhythm: Normal rate and regular rhythm.     Heart sounds: Normal heart sounds. No murmur heard. Pulmonary:     Effort: Pulmonary effort is normal.     Breath sounds: Normal breath sounds.  Musculoskeletal:     Right lower leg: No edema.     Left lower leg: No edema.  Skin:    General: Skin is warm and dry.  Neurological:     General: No focal deficit present.     Mental Status: She is alert and oriented to Santiago, place, and time.  Psychiatric:        Mood and Affect: Mood normal.        Behavior: Behavior normal.     No results found for any visits on 06/02/24.  The ASCVD Risk score (Arnett DK, et al., 2019) failed to calculate for the following reasons:   The valid HDL cholesterol range is 20 to 100 mg/dL    Assessment & Plan:   Return 2-3 months for BP.   Primary hypertension Assessment & Plan: Blood pressure elevated. Home readings average 140/80 mmHg, above target. Previously, atenolol caused severe headache. Multiple  anti-hypertensive allergies. Patient prefers non-pharmacological management due to allergies. - Continue to monitor blood pressure at home. - Implement lifestyle modifications: healthy diet, reduced salt intake, increased physical activity. - Continue magnesium supplementation. - Follow up in 2-3 months to reassess blood pressure. - Return sooner if headaches, chest pain, shortness of breath, vision changes, or blood pressure >145/90 mmHg at home.   Generalized anxiety disorder Assessment & Plan: Anxiety well-managed without Lexapro . Prefers natural remedies. Interested in lavender essential oil (Lavella). Continued magnesium supplementation with noted benefit.  Discussed lack of FDA regulation and limited data on natural supplements. - Continue current management. Follow up if symptoms worsen.     Juliano Mceachin, PA-C "

## 2024-06-02 NOTE — Assessment & Plan Note (Signed)
 Anxiety well-managed without Lexapro . Prefers natural remedies. Interested in lavender essential oil (Lavella). Continued magnesium supplementation with noted benefit. Discussed lack of FDA regulation and limited data on natural supplements. - Continue current management. Follow up if symptoms worsen.

## 2024-06-02 NOTE — Assessment & Plan Note (Signed)
 Blood pressure elevated. Home readings average 140/80 mmHg, above target. Previously, atenolol caused severe headache. Multiple anti-hypertensive allergies. Patient prefers non-pharmacological management due to allergies. - Continue to monitor blood pressure at home. - Implement lifestyle modifications: healthy diet, reduced salt intake, increased physical activity. - Continue magnesium supplementation. - Follow up in 2-3 months to reassess blood pressure. - Return sooner if headaches, chest pain, shortness of breath, vision changes, or blood pressure >145/90 mmHg at home.

## 2024-08-05 ENCOUNTER — Encounter: Admitting: Obstetrics & Gynecology

## 2024-08-31 ENCOUNTER — Ambulatory Visit: Admitting: Physician Assistant
